# Patient Record
Sex: Female | Born: 1935 | Race: White | Hispanic: No | Marital: Married | Smoking: Never smoker
Health system: Southern US, Community
[De-identification: ages and names within clinical notes are randomized; demographics above are authoritative.]

## PROBLEM LIST (undated history)

## (undated) ENCOUNTER — Ambulatory Visit

## (undated) DIAGNOSIS — C801 Malignant (primary) neoplasm, unspecified: Secondary | ICD-10-CM

## (undated) DIAGNOSIS — I4891 Unspecified atrial fibrillation: Secondary | ICD-10-CM

## (undated) DIAGNOSIS — I10 Essential (primary) hypertension: Secondary | ICD-10-CM

## (undated) HISTORY — DX: Essential (primary) hypertension: I10

## (undated) HISTORY — DX: Malignant (primary) neoplasm, unspecified: C80.1

## (undated) HISTORY — PX: CERVICAL CONIZATION W/BX: SHX1330

## (undated) HISTORY — PX: EYE SURGERY: SHX253

---
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
## 1964-11-14 DIAGNOSIS — C801 Malignant (primary) neoplasm, unspecified: Secondary | ICD-10-CM

## 1964-11-14 HISTORY — DX: Malignant (primary) neoplasm, unspecified: C80.1

## 1964-11-14 HISTORY — PX: ABDOMINAL HYSTERECTOMY: SHX81

## 2004-11-17 ENCOUNTER — Ambulatory Visit: Payer: Self-pay | Admitting: Internal Medicine

## 2005-12-09 ENCOUNTER — Ambulatory Visit: Payer: Self-pay | Admitting: Internal Medicine

## 2007-01-30 ENCOUNTER — Ambulatory Visit: Payer: Self-pay | Admitting: Internal Medicine

## 2007-09-15 HISTORY — PX: COLONOSCOPY: SHX174

## 2007-09-24 ENCOUNTER — Ambulatory Visit: Payer: Self-pay | Admitting: General Surgery

## 2008-02-19 ENCOUNTER — Ambulatory Visit: Payer: Self-pay | Admitting: Internal Medicine

## 2009-02-20 ENCOUNTER — Ambulatory Visit: Payer: Self-pay | Admitting: Internal Medicine

## 2009-10-28 ENCOUNTER — Ambulatory Visit: Payer: Self-pay | Admitting: Ophthalmology

## 2010-01-13 ENCOUNTER — Ambulatory Visit: Payer: Self-pay | Admitting: Ophthalmology

## 2010-04-27 ENCOUNTER — Ambulatory Visit: Payer: Self-pay | Admitting: Internal Medicine

## 2011-06-08 ENCOUNTER — Ambulatory Visit: Payer: Self-pay | Admitting: Internal Medicine

## 2012-07-11 ENCOUNTER — Ambulatory Visit: Payer: Self-pay | Admitting: Internal Medicine

## 2014-06-11 ENCOUNTER — Ambulatory Visit: Payer: Self-pay | Admitting: Internal Medicine

## 2015-05-11 ENCOUNTER — Other Ambulatory Visit: Payer: Self-pay | Admitting: Internal Medicine

## 2015-05-11 DIAGNOSIS — Z1231 Encounter for screening mammogram for malignant neoplasm of breast: Secondary | ICD-10-CM

## 2015-06-15 ENCOUNTER — Ambulatory Visit
Admission: RE | Admit: 2015-06-15 | Discharge: 2015-06-15 | Disposition: A | Discharge status: Home / Self Care | Payer: Medicare Other | Source: Ambulatory Visit | Attending: Internal Medicine | Admitting: Internal Medicine

## 2015-06-15 DIAGNOSIS — Z1231 Encounter for screening mammogram for malignant neoplasm of breast: Secondary | ICD-10-CM | POA: Diagnosis present

## 2015-09-21 ENCOUNTER — Encounter: Payer: Self-pay | Admitting: General Surgery

## 2015-09-22 ENCOUNTER — Ambulatory Visit (INDEPENDENT_AMBULATORY_CARE_PROVIDER_SITE_OTHER): Payer: Medicare Other | Admitting: General Surgery

## 2015-09-22 ENCOUNTER — Encounter: Payer: Self-pay | Admitting: General Surgery

## 2015-09-22 VITALS — BP 132/82 | HR 64 | Resp 16 | Ht 66.0 in | Wt 153.0 lb

## 2015-09-22 DIAGNOSIS — Z800 Family history of malignant neoplasm of digestive organs: Secondary | ICD-10-CM | POA: Diagnosis not present

## 2015-09-22 DIAGNOSIS — Z1211 Encounter for screening for malignant neoplasm of colon: Secondary | ICD-10-CM | POA: Diagnosis not present

## 2015-09-22 MED ORDER — POLYETHYLENE GLYCOL 3350 17 GM/SCOOP PO POWD
1.0000 | Freq: Once | ORAL | Status: DC
Start: 2015-09-22 — End: 2015-09-29

## 2015-09-22 NOTE — H&P (Signed)
HPI  Sarah Bird is a 79 y.o. female here today for her recall colonoscopy . Patient last colonoscopy was on 09/24/2007. No GI problems at this time. Bowels move regular but irregularly, 2-3 days of small hard formed stools then she has a large normal bm.  HPI  Past Medical History   Diagnosis  Date   .  Hypertension    .  Cancer [facility])  1966     Cervical    Past Surgical History   Procedure  Laterality  Date   .  Colonoscopy   09/2007     Dr Bary Castilla   .  Abdominal hysterectomy   1966   .  Tonsillectomy   1945   .  Cervical conization w/bx     .  Eye surgery  Bilateral  4 years ago     cataracts    Family History   Problem  Relation  Age of Onset   .  Colon cancer  Mother  30   .  Colon cancer  Brother  22   .  Breast cancer  Maternal Aunt      3 aunts age 63's    Social History  Social History   Substance Use Topics   .  Smoking status:  Never Smoker   .  Smokeless tobacco:  Never Used   .  Alcohol Use:  Yes    Allergies   Allergen  Reactions   .  Other  Other (See Comments)   .  Sulfa Antibiotics  Rash    Current Outpatient Prescriptions   Medication  Sig  Dispense  Refill   .  aspirin 81 MG tablet  Take 81 mg by mouth daily.     .  calcium carbonate (OS-CAL - DOSED IN MG OF ELEMENTAL CALCIUM) 1250 (500 CA) MG tablet  Take 1 tablet by mouth daily with breakfast.     .  losartan-hydrochlorothiazide (HYZAAR) 100-12.5 MG tablet  Take 1 tablet by mouth daily.     .  Multiple Vitamins-Minerals (ICAPS AREDS FORMULA PO)  Take by mouth daily.     .  polyethylene glycol powder (GLYCOLAX/MIRALAX) powder  Take 255 g by mouth once.  255 g  0    No current facility-administered medications for this visit.    Review of Systems  Review of Systems  Constitutional: Negative.  Respiratory: Negative.  Cardiovascular: Negative.  Gastrointestinal: Negative for diarrhea, constipation and blood in stool.   Blood pressure 132/82, pulse 64, resp. rate 16, height 5\' 6"  (1.676 m),  weight 153 lb (69.4 kg).  Physical Exam  Physical Exam  Constitutional: She is oriented to person, place, and time. She appears well-developed and well-nourished.  Eyes: Conjunctivae are normal. No scleral icterus.  Neck: Neck supple.  Cardiovascular: Normal rate, regular rhythm and normal heart sounds.  Pulmonary/Chest: Effort normal and breath sounds normal.  Lymphadenopathy:  She has no cervical adenopathy.  Neurological: She is alert and oriented to person, place, and time.  Skin: Skin is warm and dry.   Data Reviewed  2008 colonoscopy reviewed. Normal.  Assessment   Family history colon cancer.   Plan    Colonoscopy with possible biopsy/polypectomy prn: Information regarding the procedure, including its potential risks and complications (including but not limited to perforation of the bowel, which may require emergency surgery to repair, and bleeding) was verbally given to the patient. Educational information regarding lower intestinal endoscopy was given to the patient. Written instructions for how to complete  the bowel prep using Miralax were provided. The importance of drinking ample fluids to avoid dehydration as a result of the prep emphasized.  Patient is scheduled for a Colonoscopy at [facility] on 09/29/15. She will only take her Hyzaar at 6 am the morning of. Miralax prescription has been sent into her pharmacy. The patient is aware of date and instructions.  PCP: Irish Elders  09/22/2015, 9:46 PM

## 2015-09-22 NOTE — Progress Notes (Signed)
Patient ID: Sarah Bird, female   DOB: Apr 16, 1936, 79 y.o.   MRN: [id number]  Chief Complaint  Patient presents with  . Colonoscopy    HPI Sarah Bird is a 79 y.o. female here today for her recall colonoscopy . Patient last colonoscopy was on 09/24/2007. No GI problems at this time. Bowels move regular but irregularly, 2-3 days of small hard formed stools then she has a large normal bm.  HPI  Past Medical History  Diagnosis Date  . Hypertension   . Cancer [facility]) 1966    Cervical    Past Surgical History  Procedure Laterality Date  . Colonoscopy  09/2007    Dr Bary Castilla  . Abdominal hysterectomy  1966  . Tonsillectomy  1945  . Cervical conization w/bx    . Eye surgery Bilateral 4 years ago    cataracts    Family History  Problem Relation Age of Onset  . Colon cancer Mother 57  . Colon cancer Brother 85  . Breast cancer Maternal Aunt     3 aunts age 28's    Social History Social History  Substance Use Topics  . Smoking status: Never Smoker   . Smokeless tobacco: Never Used  . Alcohol Use: Yes    Allergies  Allergen Reactions  . Other Other (See Comments)  . Sulfa Antibiotics Rash    Current Outpatient Prescriptions  Medication Sig Dispense Refill  . aspirin 81 MG tablet Take 81 mg by mouth daily.    . calcium carbonate (OS-CAL - DOSED IN MG OF ELEMENTAL CALCIUM) 1250 (500 CA) MG tablet Take 1 tablet by mouth daily with breakfast.    . losartan-hydrochlorothiazide (HYZAAR) 100-12.5 MG tablet Take 1 tablet by mouth daily.     . Multiple Vitamins-Minerals (ICAPS AREDS FORMULA PO) Take by mouth daily.    . polyethylene glycol powder (GLYCOLAX/MIRALAX) powder Take 255 g by mouth once. 255 g 0   No current facility-administered medications for this visit.    Review of Systems Review of Systems  Constitutional: Negative.   Respiratory: Negative.   Cardiovascular: Negative.   Gastrointestinal: Negative for diarrhea, constipation and blood in stool.     Blood pressure 132/82, pulse 64, resp. rate 16, height 5\' 6"  (1.676 m), weight 153 lb (69.4 kg).  Physical Exam Physical Exam  Constitutional: She is oriented to person, place, and time. She appears well-developed and well-nourished.  Eyes: Conjunctivae are normal. No scleral icterus.  Neck: Neck supple.  Cardiovascular: Normal rate, regular rhythm and normal heart sounds.   Pulmonary/Chest: Effort normal and breath sounds normal.  Lymphadenopathy:    She has no cervical adenopathy.  Neurological: She is alert and oriented to person, place, and time.  Skin: Skin is warm and dry.    Data Reviewed 2008 colonoscopy reviewed. Normal.  Assessment    Family history colon cancer.    Plan        Colonoscopy with possible biopsy/polypectomy prn: Information regarding the procedure, including its potential risks and complications (including but not limited to perforation of the bowel, which may require emergency surgery to repair, and bleeding) was verbally given to the patient. Educational information regarding lower intestinal endoscopy was given to the patient. Written instructions for how to complete the bowel prep using Miralax were provided. The importance of drinking ample fluids to avoid dehydration as a result of the prep emphasized.  Patient is scheduled for a Colonoscopy at [facility] on 09/29/15. She will only take her Hyzaar at 6 am  the morning of. Miralax prescription has been sent into her pharmacy. The patient is aware of date and instructions.   PCP:  Irish Elders 09/22/2015, 9:46 PM

## 2015-09-22 NOTE — Patient Instructions (Addendum)
Colonoscopy A colonoscopy is an exam to look at the entire large intestine (colon). This exam can help find problems such as tumors, polyps, inflammation, and areas of bleeding. The exam takes about 1 hour.  LET [facility] CARE PROVIDER KNOW ABOUT:   Any allergies you have.  All medicines you are taking, including vitamins, herbs, eye drops, creams, and over-the-counter medicines.  Previous problems you or members of your family have had with the use of anesthetics.  Any blood disorders you have.  Previous surgeries you have had.  Medical conditions you have. RISKS AND COMPLICATIONS  Generally, this is a safe procedure. However, as with any procedure, complications can occur. Possible complications include:  Bleeding.  Tearing or rupture of the colon wall.  Reaction to medicines given during the exam.  Infection (rare). BEFORE THE PROCEDURE   Ask your health care provider about changing or stopping your regular medicines.  You may be prescribed an oral bowel prep. This involves drinking a large amount of medicated liquid, starting the day before your procedure. The liquid will cause you to have multiple loose stools until your stool is almost clear or light green. This cleans out your colon in preparation for the procedure.  Do not eat or drink anything else once you have started the bowel prep, unless your health care provider tells you it is safe to do so.  Arrange for someone to drive you home after the procedure. PROCEDURE   You will be given medicine to help you relax (sedative).  You will lie on your side with your knees bent.  A long, flexible tube with a light and camera on the end (colonoscope) will be inserted through the rectum and into the colon. The camera sends video back to a computer screen as it moves through the colon. The colonoscope also releases carbon dioxide gas to inflate the colon. This helps your health care provider see the area better.  During  the exam, your health care provider may take a small tissue sample (biopsy) to be examined under a microscope if any abnormalities are found.  The exam is finished when the entire colon has been viewed. AFTER THE PROCEDURE   Do not drive for 24 hours after the exam.  You may have a small amount of blood in your stool.  You may pass moderate amounts of gas and have mild abdominal cramping or bloating. This is caused by the gas used to inflate your colon during the exam.  Ask when your test results will be ready and how you will get your results. Make sure you get your test results.   This information is not intended to replace advice given to you by your health care provider. Make sure you discuss any questions you have with your health care provider.   Document Released: 10/28/2000 Document Revised: 08/21/2013 Document Reviewed: 07/08/2013 Elsevier Interactive Patient Education [employer].  Patient is scheduled for a Colonoscopy at [facility] on 09/29/15. She will only take her Hyzaar at 6 am the morning of. Miralax prescription has been sent into her pharmacy. The patient is aware of date and instructions.

## 2015-09-29 ENCOUNTER — Ambulatory Visit: Payer: Medicare Other | Admitting: Anesthesiology

## 2015-09-29 ENCOUNTER — Ambulatory Visit
Admission: RE | Admit: 2015-09-29 | Discharge: 2015-09-29 | Disposition: A | Discharge status: Home / Self Care | Payer: Medicare Other | Source: Ambulatory Visit | Attending: General Surgery | Admitting: General Surgery

## 2015-09-29 ENCOUNTER — Encounter
Admission: RE | Disposition: A | Discharge status: Home / Self Care | Payer: Self-pay | Source: Ambulatory Visit | Attending: General Surgery

## 2015-09-29 ENCOUNTER — Encounter: Payer: Self-pay | Admitting: *Deleted

## 2015-09-29 DIAGNOSIS — I10 Essential (primary) hypertension: Secondary | ICD-10-CM | POA: Insufficient documentation

## 2015-09-29 DIAGNOSIS — Z800 Family history of malignant neoplasm of digestive organs: Secondary | ICD-10-CM

## 2015-09-29 DIAGNOSIS — Z1211 Encounter for screening for malignant neoplasm of colon: Secondary | ICD-10-CM

## 2015-09-29 DIAGNOSIS — Z7982 Long term (current) use of aspirin: Secondary | ICD-10-CM | POA: Insufficient documentation

## 2015-09-29 DIAGNOSIS — Z882 Allergy status to sulfonamides status: Secondary | ICD-10-CM | POA: Insufficient documentation

## 2015-09-29 DIAGNOSIS — Z79899 Other long term (current) drug therapy: Secondary | ICD-10-CM | POA: Insufficient documentation

## 2015-09-29 HISTORY — PX: COLONOSCOPY WITH PROPOFOL: SHX5780

## 2015-09-29 SURGERY — COLONOSCOPY WITH PROPOFOL
Anesthesia: General

## 2015-09-29 MED ORDER — PROPOFOL 10 MG/ML IV BOLUS
INTRAVENOUS | Status: DC | PRN
  Administered 2015-09-29: 40 mg via INTRAVENOUS

## 2015-09-29 MED ORDER — EPHEDRINE SULFATE 50 MG/ML IJ SOLN
INTRAMUSCULAR | Status: DC | PRN
  Administered 2015-09-29: 10 mg via INTRAVENOUS

## 2015-09-29 MED ORDER — SODIUM CHLORIDE 0.9 % IV SOLN
INTRAVENOUS | Status: DC
  Administered 2015-09-29: 14:00:00 via INTRAVENOUS

## 2015-09-29 MED ORDER — LIDOCAINE HCL (CARDIAC) 20 MG/ML IV SOLN
INTRAVENOUS | Status: DC | PRN
  Administered 2015-09-29: 60 mg via INTRAVENOUS

## 2015-09-29 MED ORDER — PROPOFOL 500 MG/50ML IV EMUL
INTRAVENOUS | Status: DC | PRN
Start: 2015-09-29 — End: 2015-09-29
  Administered 2015-09-29: 140 ug/kg/min via INTRAVENOUS

## 2015-09-29 NOTE — Transfer of Care (Signed)
Immediate Anesthesia Transfer of Care Note  Patient: Sarah Bird  Procedure(s) Performed: Procedure(s): COLONOSCOPY WITH PROPOFOL (N/A)  Patient Location: Endoscopy Unit  Anesthesia Type:General  Level of Consciousness: awake, alert , oriented and patient cooperative  Airway & Oxygen Therapy: Patient Spontanous Breathing and Patient connected to nasal cannula oxygen  Post-op Assessment: Report given to RN, Post -op Vital signs reviewed and stable and Patient moving all extremities X 4  Post vital signs: Reviewed and stable  Last Vitals:  Filed Vitals:   09/29/15 1337  BP: 160/80  Pulse: 58  Temp: 36.2 C  Resp: 18    Complications: No apparent anesthesia complications

## 2015-09-29 NOTE — Op Note (Signed)
[facility]astroenterology Patient Name: Sarah Bird Procedure Date: 09/29/2015 2:18 PM MRN: [id number] Account #: [id number] Date of Birth: 1936-02-12 Admit Type: Outpatient Age: 79 Room: [facility] ENDO ROOM 1 Gender: Female Note Status: Finalized Procedure:         Colonoscopy Indications:       Family history of colon cancer in a first-degree relative Providers:         Robert Bellow, MD Referring MD:      Ocie Cornfield. Ouida Sills, MD (Referring MD) Medicines:         Monitored Anesthesia Care Complications:     No immediate complications. Procedure:         Pre-Anesthesia Assessment:                    - Prior to the procedure, a History and Physical was                     performed, and patient medications, allergies and                     sensitivities were reviewed. The patient's tolerance of                     previous anesthesia was reviewed.                    - The risks and benefits of the procedure and the sedation                     options and risks were discussed with the patient. All                     questions were answered and informed consent was obtained.                    After obtaining informed consent, the colonoscope was                     passed under direct vision. Throughout the procedure, the                     patient's blood pressure, pulse, and oxygen saturations                     were monitored continuously. The Colonoscope was                     introduced through the anus and advanced to the the cecum,                     identified by appendiceal orifice and ileocecal valve. The                     patient tolerated the procedure well. The colonoscopy was                     technically difficult and complex due to significant                     looping and a tortuous colon. Successful completion of the                     procedure was aided by changing the patient to a supine  position. The  quality of the bowel preparation was                     excellent. Findings:      The entire examined colon appeared normal on direct and retroflexion       views. Impression:        - The entire examined colon is normal on direct and                     retroflexion views.                    - No specimens collected. Recommendation:    - Repeat colonoscopy in 5 years for surveillance. Procedure Code(s): --- Professional ---                    [phone], Colonoscopy, flexible; diagnostic, including                     collection of specimen(s) by brushing or washing, when                     performed (separate procedure) Diagnosis Code(s): --- Professional ---                    Z80.0, Family history of malignant neoplasm of digestive                     organs CPT copyright 2014 American Medical Association. All rights reserved. The codes documented in this report are preliminary and upon coder review may  be revised to meet current compliance requirements. Robert Bellow, MD 09/29/2015 3:10:16 PM This report has been signed electronically. Number of Addenda: 0 Note Initiated On: 09/29/2015 2:18 PM Scope Withdrawal Time: 0 hours 8 minutes 24 seconds  Total Procedure Duration: 0 hours 38 minutes 5 seconds       [facility]

## 2015-09-29 NOTE — H&P (Signed)
Sarah Bird [id number] [birth year]     HPI: Family history colon cancer. Screening exam.   Prescriptions prior to admission  Medication Sig Dispense Refill Last Dose  . losartan-hydrochlorothiazide (HYZAAR) 100-12.5 MG tablet Take 1 tablet by mouth daily.    09/29/2015 at 0730  . aspirin 81 MG tablet Take 81 mg by mouth daily.     . calcium carbonate (OS-CAL - DOSED IN MG OF ELEMENTAL CALCIUM) 1250 (500 CA) MG tablet Take 1 tablet by mouth daily with breakfast.     . Multiple Vitamins-Minerals (ICAPS AREDS FORMULA PO) Take by mouth daily.     . polyethylene glycol powder (GLYCOLAX/MIRALAX) powder Take 255 g by mouth once. 255 g 0    Allergies  Allergen Reactions  . Other Other (See Comments)  . Sulfa Antibiotics Rash   Past Medical History  Diagnosis Date  . Hypertension   . Cancer [facility]) 1966    Cervical   Past Surgical History  Procedure Laterality Date  . Colonoscopy  09/2007    Dr Bary Castilla  . Abdominal hysterectomy  1966  . Tonsillectomy  1945  . Cervical conization w/bx    . Eye surgery Bilateral 4 years ago    cataracts   Social History   Social History  . Marital Status: Married    Spouse Name: N/A  . Number of Children: N/A  . Years of Education: N/A   Occupational History  . Not on file.   Social History Main Topics  . Smoking status: Never Smoker   . Smokeless tobacco: Never Used  . Alcohol Use: 4.2 oz/week    7 Glasses of wine per week  . Drug Use: No  . Sexual Activity: Not on file   Other Topics Concern  . Not on file   Social History Narrative   Social History   Social History Narrative     ROS: Negative.     PE: HEENT: Negative. Lungs: Clear. Cardio: RR. Robert Bellow 09/29/2015   Assessment/Plan:  Proceed with planned endoscopy.

## 2015-09-29 NOTE — Anesthesia Preprocedure Evaluation (Signed)
Anesthesia Evaluation  Patient identified by MRN, date of birth, ID band Patient awake    Reviewed: Allergy & Precautions, NPO status , Patient's Chart, lab work & pertinent test results  Airway Mallampati: II  TM Distance: >3 FB     Dental  (+) Caps   Pulmonary neg pulmonary ROS,    Pulmonary exam normal breath sounds clear to auscultation       Cardiovascular hypertension, Pt. on medications Normal cardiovascular exam     Neuro/Psych negative neurological ROS  negative psych ROS   GI/Hepatic negative GI ROS, Neg liver ROS,   Endo/Other  negative endocrine ROS  Renal/GU negative Renal ROS  negative genitourinary   Musculoskeletal negative musculoskeletal ROS (+)   Abdominal Normal abdominal exam  (+)   Peds negative pediatric ROS (+)  Hematology negative hematology ROS (+)   Anesthesia Other Findings   Reproductive/Obstetrics                             Anesthesia Physical Anesthesia Plan  ASA: II  Anesthesia Plan: General   Post-op Pain Management:    Induction: Intravenous  Airway Management Planned: Nasal Cannula  Additional Equipment:   Intra-op Plan:   Post-operative Plan:   Informed Consent: I have reviewed the patients History and Physical, chart, labs and discussed the procedure including the risks, benefits and alternatives for the proposed anesthesia with the patient or authorized representative who has indicated his/her understanding and acceptance.   Dental advisory given  Plan Discussed with: CRNA and Surgeon  Anesthesia Plan Comments:         Anesthesia Quick Evaluation

## 2015-09-30 ENCOUNTER — Encounter: Payer: Self-pay | Admitting: General Surgery

## 2015-09-30 NOTE — Anesthesia Postprocedure Evaluation (Signed)
  Anesthesia Post-op Note  Patient: Sarah Bird  Procedure(s) Performed: Procedure(s): COLONOSCOPY WITH PROPOFOL (N/A)  Anesthesia type:General  Patient location: PACU  Post pain: Pain level controlled  Post assessment: Post-op Vital signs reviewed, Patient's Cardiovascular Status Stable, Respiratory Function Stable, Patent Airway and No signs of Nausea or vomiting  Post vital signs: Reviewed and stable  Last Vitals:  Filed Vitals:   09/29/15 1547  BP: 146/77  Pulse: 57  Temp:   Resp: 19    Level of consciousness: awake, alert  and patient cooperative  Complications: No apparent anesthesia complications

## 2016-05-09 ENCOUNTER — Other Ambulatory Visit: Payer: Self-pay | Admitting: Internal Medicine

## 2016-05-09 DIAGNOSIS — Z1231 Encounter for screening mammogram for malignant neoplasm of breast: Secondary | ICD-10-CM

## 2016-06-15 ENCOUNTER — Ambulatory Visit: Payer: Medicare Other

## 2016-06-27 ENCOUNTER — Other Ambulatory Visit: Payer: Self-pay | Admitting: Internal Medicine

## 2016-06-27 ENCOUNTER — Ambulatory Visit
Admission: RE | Admit: 2016-06-27 | Discharge: 2016-06-27 | Disposition: A | Discharge status: Home / Self Care | Payer: Medicare Other | Source: Ambulatory Visit | Attending: Internal Medicine | Admitting: Internal Medicine

## 2016-06-27 DIAGNOSIS — Z1231 Encounter for screening mammogram for malignant neoplasm of breast: Secondary | ICD-10-CM | POA: Insufficient documentation

## 2017-06-08 ENCOUNTER — Other Ambulatory Visit: Payer: Self-pay | Admitting: Internal Medicine

## 2017-06-08 DIAGNOSIS — Z1231 Encounter for screening mammogram for malignant neoplasm of breast: Secondary | ICD-10-CM

## 2017-07-03 ENCOUNTER — Ambulatory Visit
Admission: RE | Admit: 2017-07-03 | Discharge: 2017-07-03 | Disposition: A | Discharge status: Home / Self Care | Payer: Medicare Other | Source: Ambulatory Visit | Attending: Internal Medicine | Admitting: Internal Medicine

## 2017-07-03 DIAGNOSIS — Z1231 Encounter for screening mammogram for malignant neoplasm of breast: Secondary | ICD-10-CM

## 2018-05-22 ENCOUNTER — Other Ambulatory Visit: Payer: Self-pay | Admitting: Internal Medicine

## 2018-05-22 DIAGNOSIS — Z1231 Encounter for screening mammogram for malignant neoplasm of breast: Secondary | ICD-10-CM

## 2018-07-04 ENCOUNTER — Ambulatory Visit
Admission: RE | Admit: 2018-07-04 | Discharge: 2018-07-04 | Disposition: A | Discharge status: Home / Self Care | Payer: Medicare Other | Source: Ambulatory Visit | Attending: Internal Medicine | Admitting: Internal Medicine

## 2018-07-04 DIAGNOSIS — Z1231 Encounter for screening mammogram for malignant neoplasm of breast: Secondary | ICD-10-CM

## 2019-06-07 ENCOUNTER — Other Ambulatory Visit: Payer: Self-pay | Admitting: Internal Medicine

## 2019-06-07 DIAGNOSIS — Z1231 Encounter for screening mammogram for malignant neoplasm of breast: Secondary | ICD-10-CM

## 2019-07-08 ENCOUNTER — Encounter (INDEPENDENT_AMBULATORY_CARE_PROVIDER_SITE_OTHER): Payer: Self-pay

## 2019-07-08 ENCOUNTER — Other Ambulatory Visit: Payer: Self-pay

## 2019-07-08 ENCOUNTER — Ambulatory Visit
Admission: RE | Admit: 2019-07-08 | Discharge: 2019-07-08 | Disposition: A | Discharge status: Home / Self Care | Payer: Medicare Other | Source: Ambulatory Visit | Attending: Internal Medicine | Admitting: Internal Medicine

## 2019-07-08 DIAGNOSIS — Z1231 Encounter for screening mammogram for malignant neoplasm of breast: Secondary | ICD-10-CM | POA: Insufficient documentation

## 2020-08-14 ENCOUNTER — Other Ambulatory Visit: Payer: Self-pay | Admitting: Internal Medicine

## 2020-08-14 DIAGNOSIS — Z1231 Encounter for screening mammogram for malignant neoplasm of breast: Secondary | ICD-10-CM

## 2020-08-26 ENCOUNTER — Ambulatory Visit
Admission: RE | Admit: 2020-08-26 | Discharge: 2020-08-26 | Disposition: A | Discharge status: Home / Self Care | Payer: Medicare Other | Source: Ambulatory Visit | Attending: Internal Medicine | Admitting: Internal Medicine

## 2020-08-26 ENCOUNTER — Other Ambulatory Visit: Payer: Self-pay

## 2020-08-26 DIAGNOSIS — Z1231 Encounter for screening mammogram for malignant neoplasm of breast: Secondary | ICD-10-CM | POA: Insufficient documentation

## 2022-09-07 IMAGING — MG DIGITAL SCREENING BILAT W/ TOMO W/ CAD
8 series · 8 of 24 positions shown · non-contrast
Comparison: Previous exam(s).

CLINICAL DATA: Screening.

EXAM:
DIGITAL SCREENING BILATERAL MAMMOGRAM WITH TOMO AND CAD

[L CC synth-2D]
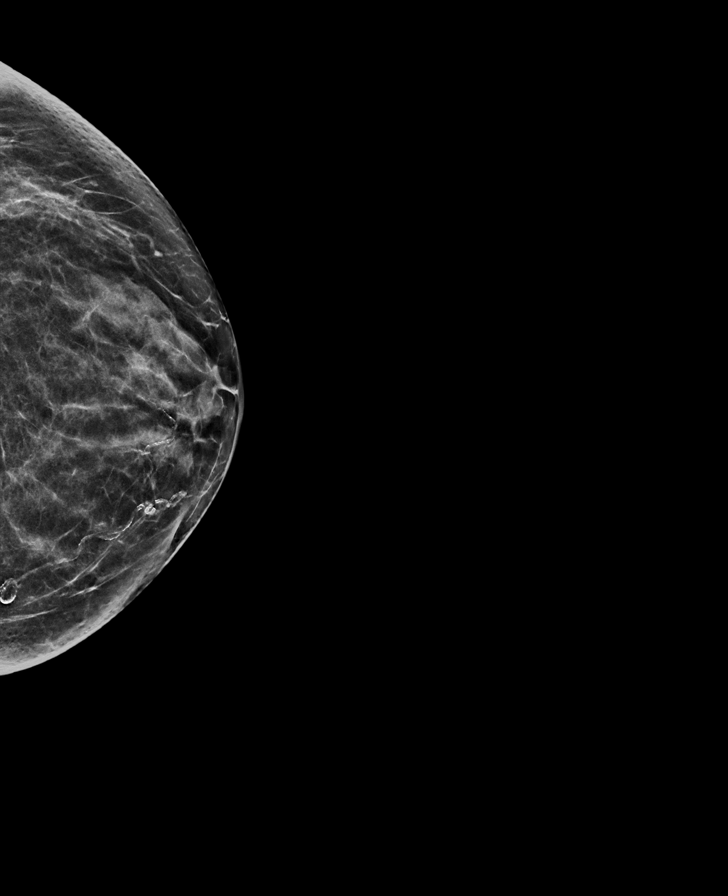

[L MLO synth-2D]
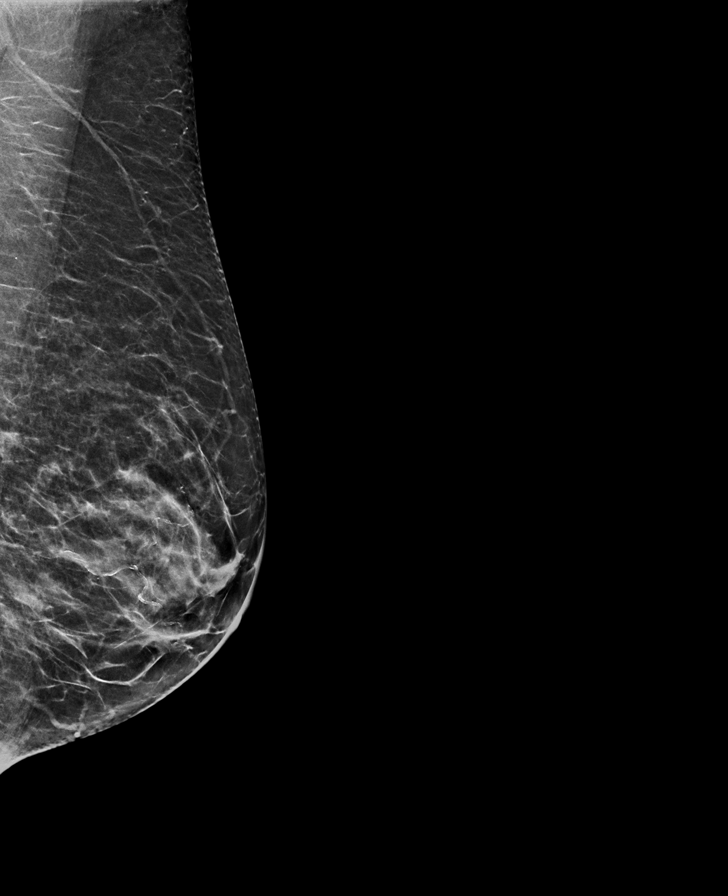

[R CC synth-2D]
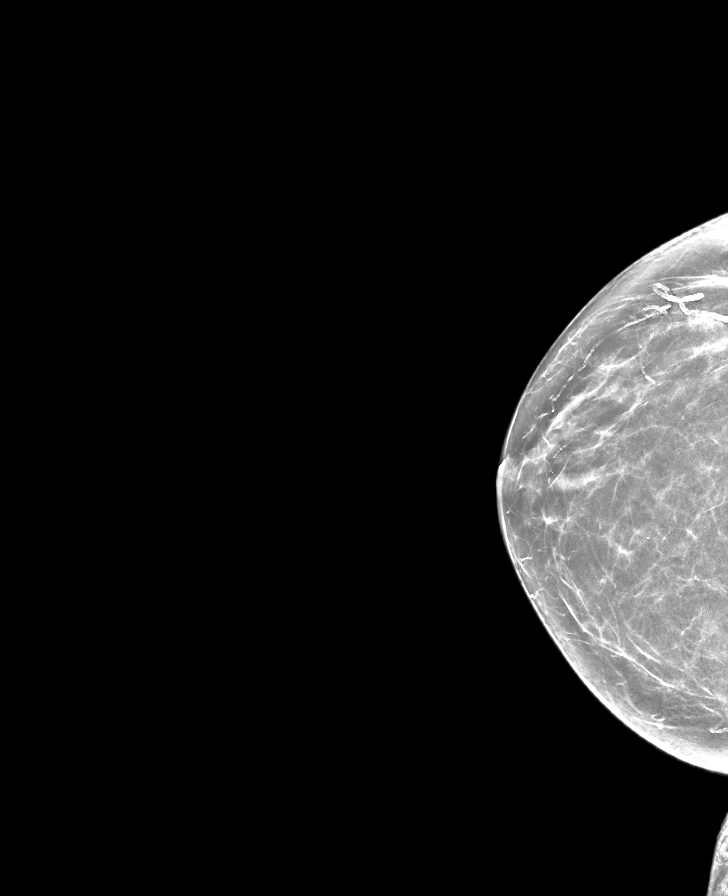

[R MLO synth-2D]
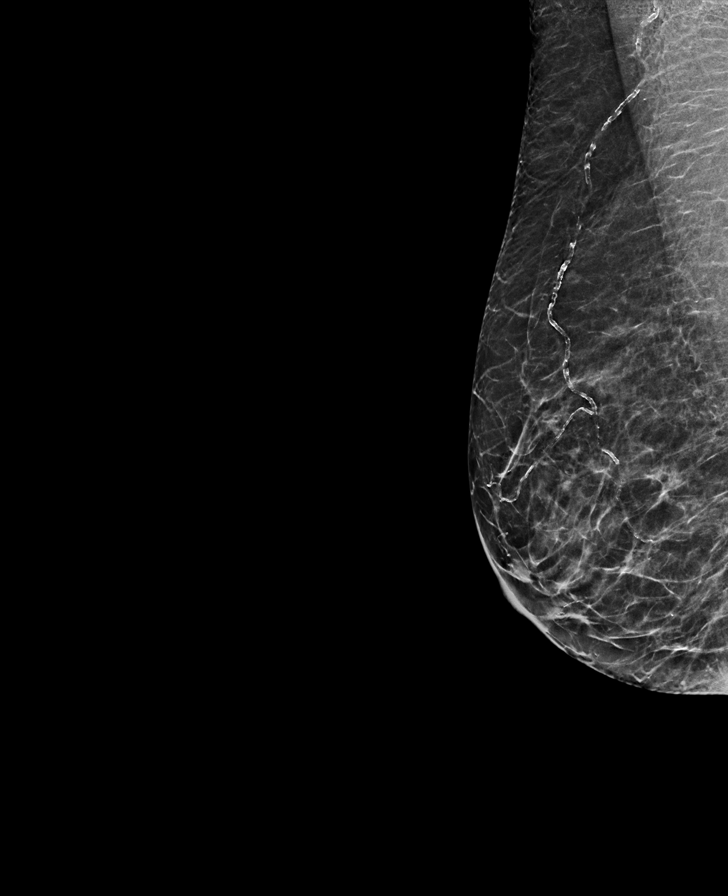

[L CC tomo · tomo slice 31/62.0]
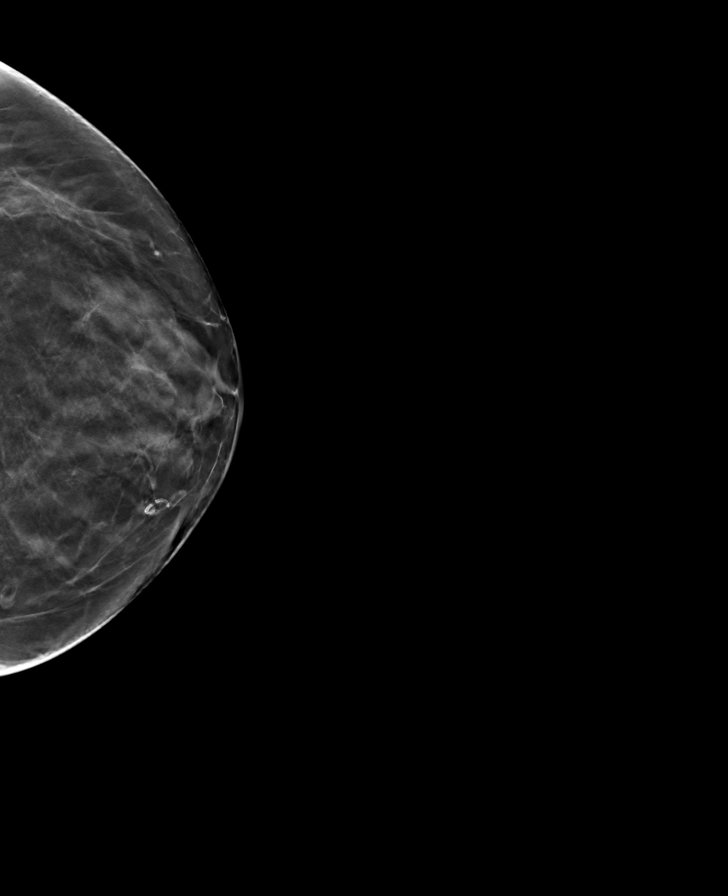

[L MLO tomo · tomo slice 31/61.0]
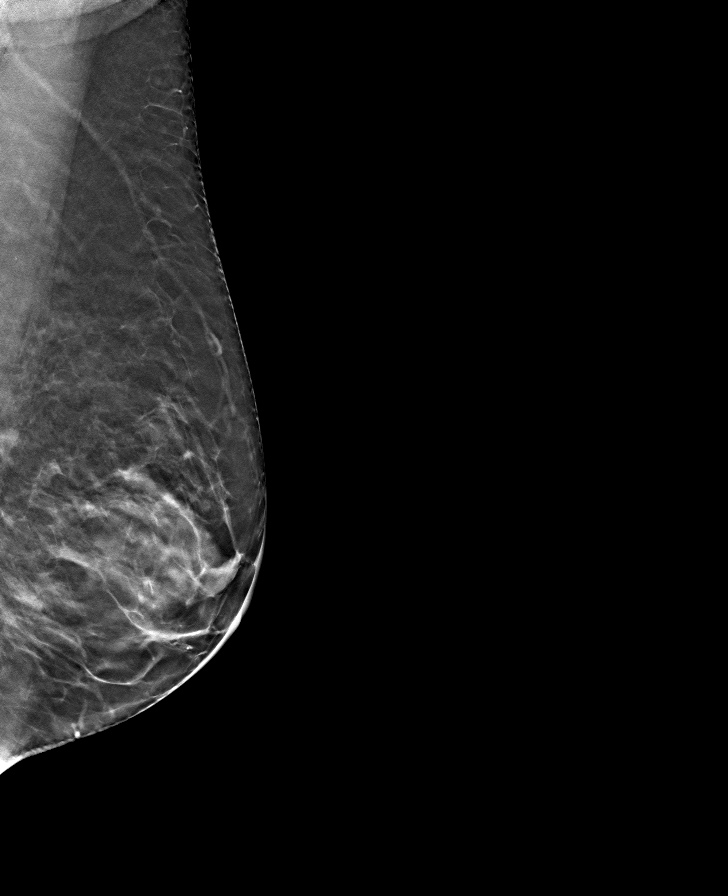

[R CC tomo · tomo slice 33/66.0]
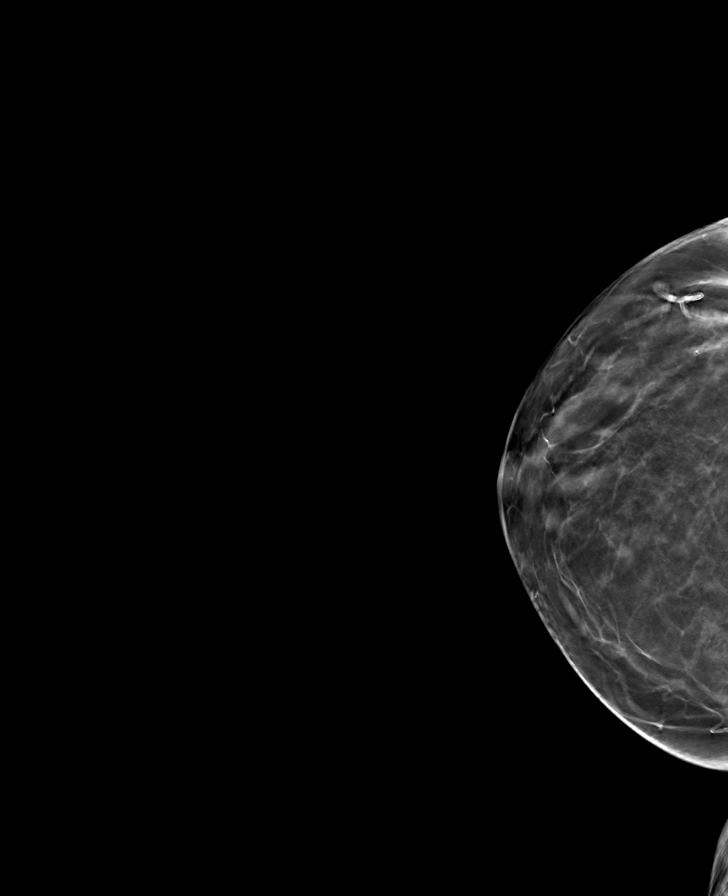

[R MLO tomo · tomo slice 31/61.0]
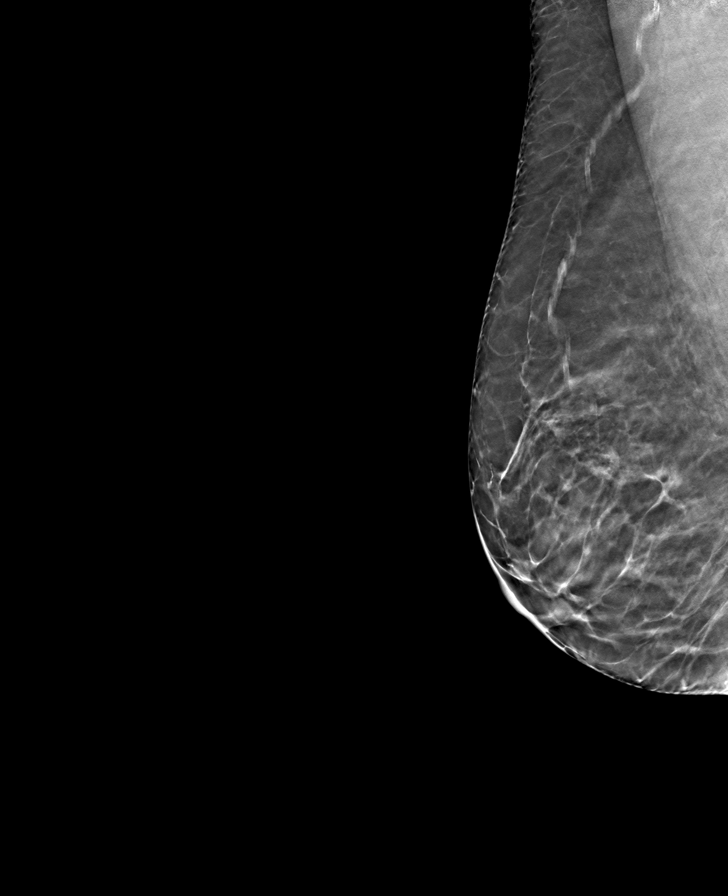

[8 of 24 positions shown; findings below may reference images not displayed]

ACR Breast Density Category b: There are scattered areas of
fibroglandular density.
FINDINGS: There are no findings suspicious for malignancy. Images were
processed with CAD.
IMPRESSION: No mammographic evidence of malignancy. A result letter of this
screening mammogram will be mailed directly to the patient.

RECOMMENDATION:
Screening mammogram in one year. (Code:CN-U-775)

BI-RADS CATEGORY  1: Negative.

## 2023-04-12 ENCOUNTER — Ambulatory Visit
Admission: EM | Admit: 2023-04-12 | Discharge: 2023-04-12 | Disposition: A | Discharge status: Home / Self Care | Payer: Medicare PPO | Attending: Physician Assistant | Admitting: Physician Assistant

## 2023-04-12 DIAGNOSIS — J029 Acute pharyngitis, unspecified: Secondary | ICD-10-CM | POA: Insufficient documentation

## 2023-04-12 DIAGNOSIS — U071 COVID-19: Secondary | ICD-10-CM | POA: Diagnosis present

## 2023-04-12 DIAGNOSIS — R051 Acute cough: Secondary | ICD-10-CM | POA: Insufficient documentation

## 2023-04-12 LAB — SARS CORONAVIRUS 2 BY RT PCR: SARS Coronavirus 2 by RT PCR: POSITIVE — AB

## 2023-04-12 LAB — GROUP A STREP BY PCR: Group A Strep by PCR: NOT DETECTED

## 2023-04-12 MED ORDER — MOLNUPIRAVIR 200 MG PO CAPS: 4.0000 | ORAL_CAPSULE | Freq: Two times a day (BID) | ORAL | 0 refills | Status: AC

## 2023-04-12 MED ORDER — IPRATROPIUM BROMIDE 0.06 % NA SOLN: 2.0000 | Freq: Four times a day (QID) | NASAL | 0 refills | Status: DC

## 2023-04-12 MED ORDER — BENZONATATE 200 MG PO CAPS: 200.0000 mg | ORAL_CAPSULE | Freq: Three times a day (TID) | ORAL | 0 refills | Status: DC | PRN

## 2023-04-12 NOTE — ED Provider Notes (Signed)
[facility]    CSN: [id number] Arrival date & time: 04/12/23  0855      History   Chief Complaint Chief Complaint  Patient presents with   Sore Throat   Nasal Congestion   Cough    HPI Sarah Bird is a 87 y.o. female presenting for 3 day history of low grade fever, fatigue, sore throat, cough and congestion. Denies chest pain, SOB, diarrhea, or vomiting. Has been taking Claritin and Mucinex without relief. No sick contacts. No other concerns.  HPI  Past Medical History:  Diagnosis Date   Cancer [facility]) 1966   Cervical   Hypertension     Patient Active Problem List   Diagnosis Date Noted   Family history of colon cancer 09/22/2015   Encounter for screening colonoscopy 09/22/2015    Past Surgical History:  Procedure Laterality Date   ABDOMINAL HYSTERECTOMY  1966   CERVICAL CONIZATION W/BX     COLONOSCOPY  09/2007   Dr Lemar Livings   COLONOSCOPY WITH PROPOFOL N/A 09/29/2015   Procedure: COLONOSCOPY WITH PROPOFOL;  Surgeon: Earline Mayotte, MD;  Location: [facility] ENDOSCOPY;  Service: Endoscopy;  Laterality: N/A;   EYE SURGERY Bilateral 4 years ago   cataracts   TONSILLECTOMY  1945    OB History     Gravida  2   Para  2   Term      Preterm      AB      Living         SAB      IAB      Ectopic      Multiple      Live Births           Obstetric Comments  1st Menstrual Cycle:  15  1st Pregnancy:  24           Home Medications    Prior to Admission medications   Medication Sig Start Date End Date Taking? Authorizing Provider  azelastine (ASTELIN) 0.1 % nasal spray Place into the nose. 02/21/23 02/21/24 Yes [provider]  benzonatate (TESSALON) 200 MG capsule Take 1 capsule (200 mg total) by mouth 3 (three) times daily as needed for cough. 04/12/23  Yes Shirlee Latch, PA-C  Cholecalciferol 50 MCG (2000 UT) TABS Take by mouth. 02/21/23 02/21/24 Yes [provider]  cyanocobalamin (VITAMIN B12) 1000 MCG tablet Take  by mouth.   Yes [provider]  diltiazem (CARDIZEM CD) 120 MG 24 hr capsule Take by mouth. 03/03/23 03/02/24 Yes [provider]  ELIQUIS 5 MG TABS tablet Take 5 mg by mouth 2 (two) times daily.   Yes [provider]  furosemide (LASIX) 20 MG tablet Take by mouth. 02/15/22  Yes [provider]  ipratropium (ATROVENT) 0.06 % nasal spray Place 2 sprays into both nostrils 4 (four) times daily. 04/12/23  Yes Shirlee Latch, PA-C  molnupiravir EUA (LAGEVRIO) 200 MG CAPS capsule Take 4 capsules (800 mg total) by mouth 2 (two) times daily for 5 days. 04/12/23 04/17/23 Yes Shirlee Latch, PA-C  Multiple Vitamins-Minerals (ICAPS AREDS FORMULA PO) Take by mouth daily.   Yes [provider]  potassium chloride (KLOR-CON) 10 MEQ tablet Take 10 mEq by mouth daily.   Yes [provider]  aspirin 81 MG tablet Take 81 mg by mouth daily.    [provider]  calcium carbonate (OS-CAL - DOSED IN MG OF ELEMENTAL CALCIUM) 1250 (500 CA) MG tablet Take 1 tablet by  mouth daily with breakfast.    [provider]  losartan-hydrochlorothiazide (HYZAAR) 100-12.5 MG tablet Take 1 tablet by mouth daily.  07/08/15   [provider]    Family History Family History  Problem Relation Age of Onset   Colon cancer Mother 30   Colon cancer Brother 75   Breast cancer Maternal Aunt        3 aunts age 50's   Breast cancer Maternal Grandmother        mat great gm    Social History Social History   Tobacco Use   Smoking status: Never   Smokeless tobacco: Never  Substance Use Topics   Alcohol use: Yes    Alcohol/week: 7.0 standard drinks of alcohol    Types: 7 Glasses of wine per week   Drug use: No     Allergies   Losartan, Other, Risedronate, and Sulfa antibiotics   Review of Systems Review of Systems  Constitutional:  Positive for fatigue and fever. Negative for chills and diaphoresis.  HENT:  Positive for congestion, rhinorrhea and  sore throat. Negative for ear pain, sinus pressure and sinus pain.   Respiratory:  Positive for cough. Negative for shortness of breath.   Cardiovascular:  Negative for chest pain.  Gastrointestinal:  Negative for abdominal pain, nausea and vomiting.  Musculoskeletal:  Negative for arthralgias and myalgias.  Skin:  Negative for rash.  Neurological:  Positive for headaches. Negative for weakness.  Hematological:  Negative for adenopathy.     Physical Exam Triage Vital Signs ED Triage Vitals  Enc Vitals Group     BP      Pulse      Resp      Temp      Temp src      SpO2      Weight      Height      Head Circumference      Peak Flow      Pain Score      Pain Loc      Pain Edu?      Excl. in GC?    No data found.  Updated Vital Signs BP (!) 124/90   Pulse 87   Temp 98.7 F (37.1 C)   Resp 20   SpO2 98%    Physical Exam Vitals and nursing note reviewed.  Constitutional:      General: She is not in acute distress.    Appearance: Normal appearance. She is not ill-appearing or toxic-appearing.  HENT:     Head: Normocephalic and atraumatic.     Right Ear: Tympanic membrane, ear canal and external ear normal.     Left Ear: Tympanic membrane, ear canal and external ear normal.     Nose: Congestion present.     Mouth/Throat:     Mouth: Mucous membranes are moist.     Pharynx: Oropharynx is clear. Posterior oropharyngeal erythema present.  Eyes:     General: No scleral icterus.       Right eye: No discharge.        Left eye: No discharge.     Conjunctiva/sclera: Conjunctivae normal.  Cardiovascular:     Rate and Rhythm: Normal rate and regular rhythm.     Heart sounds: Normal heart sounds.  Pulmonary:     Effort: Pulmonary effort is normal. No respiratory distress.     Breath sounds: Normal breath sounds.  Musculoskeletal:     Cervical back: Neck supple.  Skin:    General:  Skin is dry.  Neurological:     General: No focal deficit present.     Mental Status:  She is alert. Mental status is at baseline.     Motor: No weakness.     Gait: Gait normal.  Psychiatric:        Mood and Affect: Mood normal.        Behavior: Behavior normal.        Thought Content: Thought content normal.      UC Treatments / Results  Labs (all labs ordered are listed, but only abnormal results are displayed) Labs Reviewed  SARS CORONAVIRUS 2 BY RT PCR - Abnormal; Notable for the following components:      Result Value   SARS Coronavirus 2 by RT PCR POSITIVE (*)    All other components within normal limits  GROUP A STREP BY PCR    EKG   Radiology No results found.  Procedures Procedures (including critical care time)  Medications Ordered in UC Medications - No data to display  Initial Impression / Assessment and Plan / UC Course  I have reviewed the triage vital signs and the nursing notes.  Pertinent labs & imaging results that were available during my care of the patient were reviewed by me and considered in my medical decision making (see chart for details).   87 y/o female presents for 3 day history of cough, congestion, sore throat, and headaches.  She is afebrile and overall well appearing. On exam she has nasal congestion and mild posterior pharyngeal erythema. Chest is CTA.   PCR strep and COVID testing obtained.  Negative strep.  Positive COVID.  Discussed results of labs with patient.  Patient's last GFR from 1 month ago was 63.  Prescribed Molnupiravir as I would like to try to avoid changing her dose of Eliquis and Cardizem given her history of chronic A-fib.  Also prescribed benzonatate and Atrovent nasal spray.  Reviewed current CDC guidelines, isolation protocol and ED precautions for COVID-19.  Final Clinical Impressions(s) / UC Diagnoses   Final diagnoses:  COVID-19  Acute cough  Sore throat     Discharge Instructions      -The COVID test is positive.  I sent molnupiravir antiviral medication to the pharmacy.  The goal  of this medication is to keep you from getting sicker and ending up in the hospital. - I also sent a cough medication and nasal spray. - Increase rest and fluids.  Tylenol as needed for low-grade fever or discomfort. - You should isolate until you have been fever free for greater than 24 hours and your symptoms are starting to improve.  As long as you are coughing wear a mask around others. - Need to be seen again if you have any uncontrollable fever, weakness or shortness of breath.     ED Prescriptions     Medication Sig Dispense Auth. Provider   molnupiravir EUA (LAGEVRIO) 200 MG CAPS capsule Take 4 capsules (800 mg total) by mouth 2 (two) times daily for 5 days. 40 capsule Eusebio Friendly B, PA-C   benzonatate (TESSALON) 200 MG capsule Take 1 capsule (200 mg total) by mouth 3 (three) times daily as needed for cough. 20 capsule Eusebio Friendly B, PA-C   ipratropium (ATROVENT) 0.06 % nasal spray Place 2 sprays into both nostrils 4 (four) times daily. 15 mL Shirlee Latch, PA-C      PDMP not reviewed this encounter.   Shirlee Latch, PA-C 04/12/23 1049

## 2023-04-12 NOTE — Discharge Instructions (Addendum)
-  The COVID test is positive.  I sent molnupiravir antiviral medication to the pharmacy.  The goal of this medication is to keep you from getting sicker and ending up in the hospital. - I also sent a cough medication and nasal spray. - Increase rest and fluids.  Tylenol as needed for low-grade fever or discomfort. - You should isolate until you have been fever free for greater than 24 hours and your symptoms are starting to improve.  As long as you are coughing wear a mask around others. - Need to be seen again if you have any uncontrollable fever, weakness or shortness of breath.

## 2023-04-12 NOTE — ED Triage Notes (Signed)
Pt c/o sore throat, cough, nasal and chest congestion with low grade fevers since Sunday. Pt tried claritin and mucinex and no relief

## 2023-06-10 ENCOUNTER — Ambulatory Visit
Admission: EM | Admit: 2023-06-10 | Discharge: 2023-06-10 | Disposition: A | Discharge status: Home / Self Care | Payer: Medicare PPO | Attending: Emergency Medicine | Admitting: Emergency Medicine

## 2023-06-10 ENCOUNTER — Encounter: Payer: Self-pay | Admitting: Emergency Medicine

## 2023-06-10 DIAGNOSIS — N390 Urinary tract infection, site not specified: Secondary | ICD-10-CM | POA: Insufficient documentation

## 2023-06-10 LAB — URINALYSIS, W/ REFLEX TO CULTURE (INFECTION SUSPECTED)
Bilirubin Urine: NEGATIVE
Glucose, UA: NEGATIVE mg/dL
Ketones, ur: NEGATIVE mg/dL
Nitrite: NEGATIVE
Protein, ur: NEGATIVE mg/dL
Specific Gravity, Urine: 1.015 (ref 1.005–1.030)
pH: 6.5 (ref 5.0–8.0)

## 2023-06-10 MED ORDER — CEPHALEXIN 500 MG PO CAPS: 500.0000 mg | ORAL_CAPSULE | Freq: Two times a day (BID) | ORAL | 0 refills | Status: DC

## 2023-06-10 MED ORDER — PHENAZOPYRIDINE HCL 200 MG PO TABS: 200.0000 mg | ORAL_TABLET | Freq: Three times a day (TID) | ORAL | 0 refills | Status: DC

## 2023-06-10 NOTE — Discharge Instructions (Addendum)
Take the Keflex twice daily for 5 days with food for treatment of urinary tract infection.  Use the Pyridium every 8 hours as needed for urinary discomfort.  This will turn your urine a bright red-orange.  Increase your oral fluid intake so that you increase your urine production and or flushing your urinary system.  Take an over-the-counter probiotic, such as Culturelle-Align-Activia, 1 hour after each dose of antibiotic to prevent diarrhea or yeast infections from forming.  We will culture urine and change the antibiotics if necessary.  Return for reevaluation, or see your primary care provider, for any new or worsening symptoms.  

## 2023-06-10 NOTE — ED Provider Notes (Signed)
[facility]    CSN: [id number] Arrival date & time: 06/10/23  1028      History   Chief Complaint Chief Complaint  Patient presents with   Back Pain   Urinary Frequency    HPI Sarah Bird is a 87 y.o. female.   HPI  87 year old female with a past medical history significant for cervical cancer, hypertension presents for evaluation of urinary complaints that started yesterday.  She does have some low back pain which started 2 days ago and then yesterday she developed urinary frequency, discomfort when she needs to urinate, and has also had increased nocturia and some urinary incontinence.  She denies any fever, blood in her urine, nausea, or vomiting.  Past Medical History:  Diagnosis Date   Cancer [facility]) 1966   Cervical   Hypertension     Patient Active Problem List   Diagnosis Date Noted   Family history of colon cancer 09/22/2015   Encounter for screening colonoscopy 09/22/2015    Past Surgical History:  Procedure Laterality Date   ABDOMINAL HYSTERECTOMY  1966   CERVICAL CONIZATION W/BX     COLONOSCOPY  09/2007   Dr Lemar Livings   COLONOSCOPY WITH PROPOFOL N/A 09/29/2015   Procedure: COLONOSCOPY WITH PROPOFOL;  Surgeon: Earline Mayotte, MD;  Location: [facility] ENDOSCOPY;  Service: Endoscopy;  Laterality: N/A;   EYE SURGERY Bilateral 4 years ago   cataracts   TONSILLECTOMY  1945    OB History     Gravida  2   Para  2   Term      Preterm      AB      Living         SAB      IAB      Ectopic      Multiple      Live Births           Obstetric Comments  1st Menstrual Cycle:  15  1st Pregnancy:  24           Home Medications    Prior to Admission medications   Medication Sig Start Date End Date Taking? Authorizing Provider  cephALEXin (KEFLEX) 500 MG capsule Take 1 capsule (500 mg total) by mouth 2 (two) times daily for 5 days. 06/10/23 06/15/23 Yes Becky Augusta, NP  Cholecalciferol 50 MCG (2000 UT) TABS Take by mouth.  02/21/23 02/21/24 Yes [provider]  diltiazem (CARDIZEM CD) 120 MG 24 hr capsule Take by mouth. 03/03/23 03/02/24 Yes [provider]  ELIQUIS 5 MG TABS tablet Take 5 mg by mouth 2 (two) times daily.   Yes [provider]  furosemide (LASIX) 20 MG tablet Take by mouth. 02/15/22  Yes [provider]  Multiple Vitamins-Minerals (ICAPS AREDS FORMULA PO) Take by mouth daily.   Yes [provider]  phenazopyridine (PYRIDIUM) 200 MG tablet Take 1 tablet (200 mg total) by mouth 3 (three) times daily. 06/10/23  Yes Becky Augusta, NP  potassium chloride (KLOR-CON) 10 MEQ tablet Take 10 mEq by mouth daily.   Yes [provider]  azelastine (ASTELIN) 0.1 % nasal spray Place into the nose. 02/21/23 02/21/24  [provider]  cyanocobalamin (VITAMIN B12) 1000 MCG tablet Take by mouth.    [provider]    Family History Family History  Problem Relation Age of Onset   Colon cancer Mother 33   Colon cancer Brother 81   Breast cancer Maternal Aunt  3 aunts age 21's   Breast cancer Maternal Grandmother        mat great gm    Social History Social History   Tobacco Use   Smoking status: Never   Smokeless tobacco: Never  Vaping Use   Vaping status: Never Used  Substance Use Topics   Alcohol use: Yes    Alcohol/week: 7.0 standard drinks of alcohol    Types: 7 Glasses of wine per week   Drug use: No     Allergies   Losartan, Other, Risedronate, and Sulfa antibiotics   Review of Systems Review of Systems  Constitutional:  Negative for fever.  Genitourinary:  Positive for dysuria, frequency and urgency. Negative for hematuria.  Musculoskeletal:  Negative for back pain.     Physical Exam Triage Vital Signs ED Triage Vitals  Encounter Vitals Group     BP      Systolic BP Percentile      Diastolic BP Percentile      Pulse      Resp      Temp      Temp src      SpO2      Weight      Height      Head Circumference       Peak Flow      Pain Score      Pain Loc      Pain Education      Exclude from Growth Chart    No data found.  Updated Vital Signs BP (!) 156/94 (BP Location: Left Arm)   Pulse 73   Temp 98.6 F (37 C) (Oral)   Resp 16   Ht 5\' 6"  (1.676 m)   Wt 149 lb 14.6 oz (68 kg)   SpO2 98%   BMI 24.20 kg/m   Visual Acuity Right Eye Distance:   Left Eye Distance:   Bilateral Distance:    Right Eye Near:   Left Eye Near:    Bilateral Near:     Physical Exam Vitals and nursing note reviewed.  Constitutional:      Appearance: Normal appearance. She is not ill-appearing.  HENT:     Head: Normocephalic and atraumatic.  Cardiovascular:     Rate and Rhythm: Normal rate and regular rhythm.     Pulses: Normal pulses.     Heart sounds: Normal heart sounds. No murmur heard.    No friction rub. No gallop.  Pulmonary:     Effort: Pulmonary effort is normal.     Breath sounds: Normal breath sounds. No wheezing, rhonchi or rales.  Abdominal:     Tenderness: There is no right CVA tenderness or left CVA tenderness.  Skin:    General: Skin is warm and dry.     Capillary Refill: Capillary refill takes less than 2 seconds.  Neurological:     General: No focal deficit present.     Mental Status: She is alert and oriented to person, place, and time.      UC Treatments / Results  Labs (all labs ordered are listed, but only abnormal results are displayed) Labs Reviewed  URINALYSIS, W/ REFLEX TO CULTURE (INFECTION SUSPECTED) - Abnormal; Notable for the following components:      Result Value   Hgb urine dipstick MODERATE (*)    Leukocytes,Ua LARGE (*)    Bacteria, UA MANY (*)    All other components within normal limits  URINE CULTURE    EKG   Radiology No results  found.  Procedures Procedures (including critical care time)  Medications Ordered in UC Medications - No data to display  Initial Impression / Assessment and Plan / UC Course  I have reviewed the triage vital  signs and the nursing notes.  Pertinent labs & imaging results that were available during my care of the patient were reviewed by me and considered in my medical decision making (see chart for details).   Patient is a nontoxic-appearing 87 year old female presenting for evaluation of 2 days with urinary symptoms as outlined in HPI above.  On exam she has no CVA tenderness.  Cardiopulmonary exam is benign.  I will order urinalysis to evaluate for the presence of UTI.  Urinalysis shows moderate hemoglobin with large leukocyte esterase.  Negative for nitrites or protein.  Reflex microscopy shows 21-50 WBCs, 11-20 RBCs, and many bacteria.  Urine will reflex to culture.  I will discharge patient home on Keflex 500 mg twice daily for 5 days for treatment of UTI along with Pyridium to help with urinary discomfort.  Return precautions reviewed.   Final Clinical Impressions(s) / UC Diagnoses   Final diagnoses:  Lower urinary tract infectious disease     Discharge Instructions      Take the Keflex twice daily for 5 days with food for treatment of urinary tract infection.  Use the Pyridium every 8 hours as needed for urinary discomfort.  This will turn your urine a bright red-orange.  Increase your oral fluid intake so that you increase your urine production and or flushing your urinary system.  Take an over-the-counter probiotic, such as Culturelle-Align-Activia, 1 hour after each dose of antibiotic to prevent diarrhea or yeast infections from forming.  We will culture urine and change the antibiotics if necessary.  Return for reevaluation, or see your primary care provider, for any new or worsening symptoms.      ED Prescriptions     Medication Sig Dispense Auth. Provider   cephALEXin (KEFLEX) 500 MG capsule Take 1 capsule (500 mg total) by mouth 2 (two) times daily for 5 days. 10 capsule Becky Augusta, NP   phenazopyridine (PYRIDIUM) 200 MG tablet Take 1 tablet (200 mg total) by mouth  3 (three) times daily. 6 tablet Becky Augusta, NP      PDMP not reviewed this encounter.   Becky Augusta, NP 06/10/23 1131

## 2023-06-10 NOTE — ED Triage Notes (Signed)
Pt c/o lower back pain, urinary frequency, and incontinence. Started yesterday.

## 2023-06-12 LAB — URINE CULTURE: Culture: 30000 — AB

## 2023-06-15 ENCOUNTER — Ambulatory Visit
Admission: RE | Admit: 2023-06-15 | Discharge: 2023-06-15 | Disposition: A | Discharge status: Home / Self Care | Payer: Medicare PPO | Source: Ambulatory Visit | Attending: Family Medicine | Admitting: Family Medicine

## 2023-06-15 VITALS — BP 129/93 | HR 82 | Temp 98.0°F | Ht 66.0 in | Wt 170.0 lb

## 2023-06-15 DIAGNOSIS — N3001 Acute cystitis with hematuria: Secondary | ICD-10-CM

## 2023-06-15 LAB — URINALYSIS, W/ REFLEX TO CULTURE (INFECTION SUSPECTED)
Bilirubin Urine: NEGATIVE
Glucose, UA: NEGATIVE mg/dL
Hgb urine dipstick: NEGATIVE
Ketones, ur: NEGATIVE mg/dL
Nitrite: NEGATIVE
Protein, ur: NEGATIVE mg/dL
RBC / HPF: NONE SEEN RBC/hpf (ref 0–5)
Specific Gravity, Urine: 1.01 (ref 1.005–1.030)
pH: 6 (ref 5.0–8.0)

## 2023-06-15 MED ORDER — CEFDINIR 300 MG PO CAPS: 300.0000 mg | ORAL_CAPSULE | Freq: Two times a day (BID) | ORAL | 0 refills | Status: DC

## 2023-06-15 NOTE — ED Provider Notes (Signed)
[facility]    CSN: [id number] Arrival date & time: 06/15/23  1417      History   Chief Complaint Chief Complaint  Patient presents with   Urinary Tract Infection     HPI HPI Sarah Bird is a 87 y.o. female.    Sarah Bird presents for concern for UTI. She just finished antibiotics yesterday.  Has back pain, urinary frequency. Took her antibiotics for UTI that were prescribed earlier and feels like her symptoms didn't go away.    Has swelling in her legs. She denies shortness of breath, PND, chest pain. Does not follow with a cardiologist.    Past Medical History:  Diagnosis Date   Cancer [facility]) 1966   Cervical   Hypertension     Patient Active Problem List   Diagnosis Date Noted   Family history of colon cancer 09/22/2015   Encounter for screening colonoscopy 09/22/2015    Past Surgical History:  Procedure Laterality Date   ABDOMINAL HYSTERECTOMY  1966   CERVICAL CONIZATION W/BX     COLONOSCOPY  09/2007   Dr Lemar Livings   COLONOSCOPY WITH PROPOFOL N/A 09/29/2015   Procedure: COLONOSCOPY WITH PROPOFOL;  Surgeon: Earline Mayotte, MD;  Location: [facility] ENDOSCOPY;  Service: Endoscopy;  Laterality: N/A;   EYE SURGERY Bilateral 4 years ago   cataracts   TONSILLECTOMY  1945    OB History     Gravida  2   Para  2   Term      Preterm      AB      Living         SAB      IAB      Ectopic      Multiple      Live Births           Obstetric Comments  1st Menstrual Cycle:  15  1st Pregnancy:  24           Home Medications    Prior to Admission medications   Medication Sig Start Date End Date Taking? Authorizing Provider  azelastine (ASTELIN) 0.1 % nasal spray Place into the nose. 02/21/23 02/21/24 Yes [provider]  cefdinir (OMNICEF) 300 MG capsule Take 1 capsule (300 mg total) by mouth 2 (two) times daily. 06/15/23  Yes Charliene Inoue, Seward Meth, DO  Cholecalciferol 50 MCG (2000 UT) TABS Take by mouth. 02/21/23 02/21/24 Yes  [provider]  cyanocobalamin (VITAMIN B12) 1000 MCG tablet Take by mouth.   Yes [provider]  diltiazem (CARDIZEM CD) 120 MG 24 hr capsule Take by mouth. 03/03/23 03/02/24 Yes [provider]  ELIQUIS 5 MG TABS tablet Take 5 mg by mouth 2 (two) times daily.   Yes [provider]  furosemide (LASIX) 20 MG tablet Take by mouth. 02/15/22  Yes [provider]  Multiple Vitamins-Minerals (ICAPS AREDS FORMULA PO) Take by mouth daily.   Yes [provider]  phenazopyridine (PYRIDIUM) 200 MG tablet Take 1 tablet (200 mg total) by mouth 3 (three) times daily. 06/10/23  Yes Becky Augusta, NP  potassium chloride (KLOR-CON) 10 MEQ tablet Take 10 mEq by mouth daily.   Yes [provider]    Family History Family History  Problem Relation Age of Onset   Colon cancer Mother 75   Colon cancer Brother 37   Breast cancer Maternal Aunt        3 aunts age 66's   Breast cancer Maternal Grandmother  mat great gm    Social History Social History   Tobacco Use   Smoking status: Never   Smokeless tobacco: Never  Vaping Use   Vaping status: Never Used  Substance Use Topics   Alcohol use: Yes    Alcohol/week: 7.0 standard drinks of alcohol    Types: 7 Glasses of wine per week   Drug use: No     Allergies   Losartan, Other, Risedronate, and Sulfa antibiotics   Review of Systems Review of Systems: :negative unless otherwise stated in HPI.      Physical Exam Triage Vital Signs ED Triage Vitals  Encounter Vitals Group     BP 06/15/23 1438 (!) 129/93     Systolic BP Percentile --      Diastolic BP Percentile --      Pulse Rate 06/15/23 1438 82     Resp --      Temp 06/15/23 1438 98 F (36.7 C)     Temp Source 06/15/23 1438 Oral     SpO2 06/15/23 1438 97 %     Weight 06/15/23 1437 170 lb (77.1 kg)     Height 06/15/23 1437 5\' 6"  (1.676 m)     Head Circumference --      Peak Flow --      Pain Score 06/15/23 1436 0      Pain Loc --      Pain Education --      Exclude from Growth Chart --    No data found.  Updated Vital Signs BP (!) 129/93 (BP Location: Left Arm)   Pulse 82   Temp 98 F (36.7 C) (Oral)   Ht 5\' 6"  (1.676 m)   Wt 77.1 kg   SpO2 97%   BMI 27.44 kg/m   Visual Acuity Right Eye Distance:   Left Eye Distance:   Bilateral Distance:    Right Eye Near:   Left Eye Near:    Bilateral Near:     Physical Exam GEN: well appearing female in no acute distress  CVS: well perfused  RESP: speaking in full sentences without pause      UC Treatments / Results  Labs (all labs ordered are listed, but only abnormal results are displayed) Labs Reviewed  URINALYSIS, W/ REFLEX TO CULTURE (INFECTION SUSPECTED) - Abnormal; Notable for the following components:      Result Value   Leukocytes,Ua SMALL (*)    Bacteria, UA FEW (*)    All other components within normal limits    EKG   Radiology No results found.  Procedures Procedures (including critical care time)  Medications Ordered in UC Medications - No data to display  Initial Impression / Assessment and Plan / UC Course  I have reviewed the triage vital signs and the nursing notes.  Pertinent labs & imaging results that were available during my care of the patient were reviewed by me and considered in my medical decision making (see chart for details).     Acute cystitis:  Patient is a 87 y.o. female  who presents for continued dysuria and urinary frequency.  Overall patient is well-appearing and afebrile.  Vital signs stable.    On chart review, Keflex prescribed for UTI twice a day. She may not have gotten good urine concentration with Keflex given twice a day. UA consistent with possible ongoing UTI. Treat with Cefdinir 2 times daily for 5 days. Urine culture was pansensitive on 06/10/23 when she was diagnosed and given Keflex.  Return  precautions including abdominal pain, fever, chills, nausea, or vomiting given.  Follow-up,  if symptoms not improving or getting worse. Discussed MDM, treatment plan and plan for follow-up with patient and her daughter who agree with plan.      Final Clinical Impressions(s) / UC Diagnoses   Final diagnoses:  Acute cystitis with hematuria     Discharge Instructions      Stop by the pharmacy to pick up your prescriptions.  Follow up with your primary care provider as needed.       ED Prescriptions     Medication Sig Dispense Auth. Provider   cefdinir (OMNICEF) 300 MG capsule Take 1 capsule (300 mg total) by mouth 2 (two) times daily. 10 capsule Katha Cabal, DO      PDMP not reviewed this encounter.   Katha Cabal, DO 06/15/23 1638

## 2023-06-15 NOTE — Discharge Instructions (Signed)
Stop by the pharmacy to pick up your prescriptions.  Follow up with your primary care provider as needed.  

## 2023-06-15 NOTE — ED Triage Notes (Signed)
Pt c/o continued discomfort, and lower leg swelling.  Pt states that she was seen earlier for a UTI and finished her antibiotics yesterday.   Pt states that she had a headache yesterday.  Pt states that her symptoms do not feel as bad.

## 2023-06-20 ENCOUNTER — Ambulatory Visit: Payer: Medicare PPO

## 2024-03-21 ENCOUNTER — Ambulatory Visit

## 2024-03-21 ENCOUNTER — Ambulatory Visit
Admission: RE | Admit: 2024-03-21 | Discharge: 2024-03-21 | Disposition: A | Discharge status: Home / Self Care | Source: Ambulatory Visit | Attending: Family Medicine | Admitting: Family Medicine

## 2024-03-21 VITALS — BP 146/93 | HR 89 | Temp 97.6°F | Resp 16

## 2024-03-21 DIAGNOSIS — N3001 Acute cystitis with hematuria: Secondary | ICD-10-CM | POA: Diagnosis not present

## 2024-03-21 LAB — URINALYSIS, ROUTINE W REFLEX MICROSCOPIC
Bilirubin Urine: NEGATIVE
Glucose, UA: NEGATIVE mg/dL
Ketones, ur: NEGATIVE mg/dL
Nitrite: NEGATIVE
Protein, ur: NEGATIVE mg/dL
Specific Gravity, Urine: 1.01 (ref 1.005–1.030)
pH: 6 (ref 5.0–8.0)

## 2024-03-21 LAB — URINALYSIS, MICROSCOPIC (REFLEX): WBC, UA: >50 WBC/hpf (ref 0–5)

## 2024-03-21 MED ORDER — NITROFURANTOIN MONOHYD MACRO 100 MG PO CAPS: 100.0000 mg | ORAL_CAPSULE | Freq: Two times a day (BID) | ORAL | 0 refills | Status: DC

## 2024-03-21 NOTE — ED Provider Notes (Signed)
 [facility]    CSN: [id number] Arrival date & time: 03/21/24  1109      History   Chief Complaint Chief Complaint  Patient presents with   Urinary Frequency     HPI HPI CHARLISSA BRYAND is a 88 y.o. female.    Alban Alm presents for lower abdominal discomfort, dysuria, urinary frequency that started yesterday.  Tried nothing prior to arrival.  Has not had any antibiotics in last 30 days.   No LMP recorded. Patient has had a hysterectomy.    - Abnormal vaginal discharge: no - vaginal odor: no - vaginal bleeding: no - Dysuria: yes - Hematuria: no - Urinary urgency: yes  - Urinary frequency: yes  - Fever: no - Abdominal pain: lower  - Pelvic pain: no - Rash/Skin lesions/mouth ulcers: no - Nausea: no  - Vomiting: no  - Back Pain: no        Past Medical History:  Diagnosis Date   Cancer ([facility]) 1966   Cervical   Hypertension     Patient Active Problem List   Diagnosis Date Noted   Family history of colon cancer 09/22/2015   Encounter for screening colonoscopy 09/22/2015    Past Surgical History:  Procedure Laterality Date   ABDOMINAL HYSTERECTOMY  1966   CERVICAL CONIZATION W/BX     COLONOSCOPY  09/2007   Dr Marquita Situ   COLONOSCOPY WITH PROPOFOL  N/A 09/29/2015   Procedure: COLONOSCOPY WITH PROPOFOL ;  Surgeon: Marshall Skeeter, MD;  Location: [facility] ENDOSCOPY;  Service: Endoscopy;  Laterality: N/A;   EYE SURGERY Bilateral 4 years ago   cataracts   TONSILLECTOMY  1945    OB History     Gravida  2   Para  2   Term      Preterm      AB      Living         SAB      IAB      Ectopic      Multiple      Live Births           Obstetric Comments  1st Menstrual Cycle:  15  1st Pregnancy:  24           Home Medications    Prior to Admission medications   Medication Sig Start Date End Date Taking? Authorizing Provider  cyanocobalamin (VITAMIN B12) 1000 MCG tablet Take by mouth.   Yes [provider]   diltiazem (CARDIZEM CD) 120 MG 24 hr capsule Take by mouth. 03/03/23 03/21/24 Yes [provider]  ELIQUIS 5 MG TABS tablet Take 5 mg by mouth 2 (two) times daily.   Yes [provider]  furosemide (LASIX) 20 MG tablet Take by mouth. 02/15/22  Yes [provider]  Multiple Vitamins-Minerals (ICAPS AREDS FORMULA PO) Take by mouth daily.   Yes [provider]  nitrofurantoin, macrocrystal-monohydrate, (MACROBID) 100 MG capsule Take 1 capsule (100 mg total) by mouth 2 (two) times daily. 03/21/24  Yes Shaely Gadberry, DO  potassium chloride (KLOR-CON) 10 MEQ tablet Take 10 mEq by mouth daily.   Yes [provider]  azelastine (ASTELIN) 0.1 % nasal spray Place into the nose. 02/21/23 02/21/24  [provider]  cefdinir  (OMNICEF ) 300 MG capsule Take 1 capsule (300 mg total) by mouth 2 (two) times daily. 06/15/23   Sonita Michiels, DO  phenazopyridine  (PYRIDIUM ) 200 MG tablet Take 1 tablet (200 mg total) by mouth 3 (three) times daily. 06/10/23  Kent Pear, NP    Family History Family History  Problem Relation Age of Onset   Colon cancer Mother 77   Colon cancer Brother 26   Breast cancer Maternal Aunt        3 aunts age 2's   Breast cancer Maternal Grandmother        mat great gm    Social History Social History   Tobacco Use   Smoking status: Never   Smokeless tobacco: Never  Vaping Use   Vaping status: Never Used  Substance Use Topics   Alcohol use: Yes    Alcohol/week: 7.0 standard drinks of alcohol    Types: 7 Glasses of wine per week   Drug use: No     Allergies   Losartan, Other, Risedronate, and Sulfa antibiotics   Review of Systems Review of Systems: :negative unless otherwise stated in HPI.      Physical Exam Triage Vital Signs ED Triage Vitals  Encounter Vitals Group     BP 03/21/24 1154 (!) 146/93     Systolic BP Percentile --      Diastolic BP Percentile --      Pulse Rate 03/21/24 1154 89     Resp 03/21/24  1154 16     Temp 03/21/24 1154 97.6 F (36.4 C)     Temp Source 03/21/24 1154 Oral     SpO2 03/21/24 1154 96 %     Weight --      Height --      Head Circumference --      Peak Flow --      Pain Score 03/21/24 1155 0     Pain Loc --      Pain Education --      Exclude from Growth Chart --    No data found.  Updated Vital Signs BP (!) 146/93 (BP Location: Left Arm)   Pulse 89   Temp 97.6 F (36.4 C) (Oral)   Resp 16   SpO2 96%   Visual Acuity Right Eye Distance:   Left Eye Distance:   Bilateral Distance:    Right Eye Near:   Left Eye Near:    Bilateral Near:     Physical Exam GEN: well appearing female in no acute distress  CVS: well perfused  RESP: speaking in full sentences without pause  ABD: soft, suprapubic tenderness, non-distended, no palpable masses, no CVA tenderness bilaterally    UC Treatments / Results  Labs (all labs ordered are listed, but only abnormal results are displayed) Labs Reviewed  URINALYSIS, ROUTINE W REFLEX MICROSCOPIC - Abnormal; Notable for the following components:      Result Value   Hgb urine dipstick SMALL (*)    Leukocytes,Ua MODERATE (*)    All other components within normal limits  URINALYSIS, MICROSCOPIC (REFLEX) - Abnormal; Notable for the following components:   Bacteria, UA MANY (*)    All other components within normal limits  URINE CULTURE    EKG   Radiology No results found.  Procedures Procedures (including critical care time)  Medications Ordered in UC Medications - No data to display  Initial Impression / Assessment and Plan / UC Course  I have reviewed the triage vital signs and the nursing notes.  Pertinent labs & imaging results that were available during my care of the patient were reviewed by me and considered in my medical decision making (see chart for details).      Acute cystitis:  Patient is a 88 y.o.  female  who presents for 1 day of dysuria and urinary frequency with urgency.  Overall  patient is well-appearing and afebrile.  Vital signs stable.  UA consistent with acute cystitis.  Hematuria supported on microscopy.  Patient has 2 UTI's in the past year. Treat with Macrobid 2 times daily for 5 days.  Urine culture obtained.  Follow-up sensitivities and change antibiotics, if needed.   - Return precautions including abdominal pain, fever, chills, nausea, or vomiting given. Follow-up,  if symptoms not improving or getting worse. Discussed MDM, treatment plan and plan for follow-up with patientwho agrees with plan.        Final Clinical Impressions(s) / UC Diagnoses   Final diagnoses:  Acute cystitis with hematuria     Discharge Instructions      You have a urinary tract infection. Stop by the pharmacy to pick up your prescriptions.  Follow up with your primary care provider or return to the urgent care, if not improving.   I sent your urine for culture. Someone may call you to change antibiotics.    ED Prescriptions     Medication Sig Dispense Auth. Provider   nitrofurantoin, macrocrystal-monohydrate, (MACROBID) 100 MG capsule Take 1 capsule (100 mg total) by mouth 2 (two) times daily. 14 capsule Cayde Held, DO      PDMP not reviewed this encounter.   Amarachi Kotz, DO 03/21/24 1718

## 2024-03-21 NOTE — ED Triage Notes (Signed)
 Urinary frequency, lower abdominal pain, burning with urination x 1 day.

## 2024-03-21 NOTE — Discharge Instructions (Addendum)
 You have a urinary tract infection. Stop by the pharmacy to pick up your prescriptions.  Follow up with your primary care provider or return to the urgent care, if not improving.   I sent your urine for culture. Someone may call you to change antibiotics.

## 2024-03-23 LAB — URINE CULTURE: Culture: 50000 — AB

## 2024-03-25 ENCOUNTER — Ambulatory Visit: Payer: Self-pay

## 2024-03-25 NOTE — Telephone Encounter (Signed)
 Summary: urinary discomfort   Copied From CRM [id number]. Reason for Triage: The patient is calling on the community line to share that they are still experiencing the urinary discomfort and concerns that they were previously treated for roughly 1 week ago  The patient shares that their frequent urination has decreased but still continues some as well as their lower back discomfort  The patient would like to be contacted to discuss further when possible          Chief Complaint: urinary symptoms Symptoms: frequency and discomfort Frequency: constant Pertinent Negatives: Patient denies fever Disposition: [] ED /[] Urgent Care (no appt availability in office) / [] Appointment(In office/virtual)/ []  [facility]/ [] Home Care/ [] Refused Recommended Disposition /[] [facility]/ []  Follow-up with PCP Additional Notes: Pt seen in UC for UTI, started on macrobid . Pt reports persisting symptoms and would like a f/u visit. Patient does not have a  provider, declined to establish care for an acute visit. RN advising patient to follow-up with her PCP or return to urgent care for persisting symptoms.   Reason for Disposition  [1] Taking antibiotic > 72 hours (3 days) for UTI AND [2] painful urination or frequency is SAME (unchanged, not better)  Answer Assessment - Initial Assessment Questions 1. SYMPTOM: "What's the main symptom you're concerned about?" (e.g., frequency, incontinence)     Frequency 2. ONSET: "When did the  frequency   start?"     10 days  3. PAIN: "Is there any pain?" If Yes, ask: "How bad is it?" (Scale: 1-10; mild, moderate, severe)     Skipped  4. CAUSE: "What do you think is causing the symptoms?"     Uti  5. OTHER SYMPTOMS: "Do you have any other symptoms?" (e.g., blood in urine, fever, flank pain, pain with urination)     Discomfort when urinating  6. PREGNANCY: "Is there any chance you are pregnant?" "When was your last menstrual period?"      no  Protocols used: Urinary Symptoms-A-AH, Urinary Tract Infection on Antibiotic Follow-up Call - [facility]

## 2024-03-26 ENCOUNTER — Ambulatory Visit: Payer: Self-pay

## 2024-05-24 ENCOUNTER — Ambulatory Visit
Admission: RE | Admit: 2024-05-24 | Discharge: 2024-05-24 | Disposition: A | Discharge status: Home / Self Care | Source: Ambulatory Visit | Attending: Emergency Medicine | Admitting: Emergency Medicine

## 2024-05-24 VITALS — BP 130/84 | HR 86 | Temp 97.7°F | Resp 15 | Ht 66.0 in | Wt 170.0 lb

## 2024-05-24 DIAGNOSIS — N390 Urinary tract infection, site not specified: Secondary | ICD-10-CM | POA: Diagnosis not present

## 2024-05-24 LAB — URINALYSIS, W/ REFLEX TO CULTURE (INFECTION SUSPECTED)
Bilirubin Urine: NEGATIVE
Glucose, UA: NEGATIVE mg/dL
Ketones, ur: NEGATIVE mg/dL
Nitrite: NEGATIVE
Protein, ur: NEGATIVE mg/dL
Specific Gravity, Urine: 1.015 (ref 1.005–1.030)
pH: 6 (ref 5.0–8.0)

## 2024-05-24 MED ORDER — PHENAZOPYRIDINE HCL 200 MG PO TABS: 200.0000 mg | ORAL_TABLET | Freq: Three times a day (TID) | ORAL | 0 refills | Status: DC

## 2024-05-24 MED ORDER — NITROFURANTOIN MONOHYD MACRO 100 MG PO CAPS: 100.0000 mg | ORAL_CAPSULE | Freq: Two times a day (BID) | ORAL | 0 refills | Status: DC

## 2024-05-24 NOTE — ED Triage Notes (Signed)
 Patient reports urinary frequency and dysuria that started last night.

## 2024-05-24 NOTE — ED Provider Notes (Signed)
 [facility]    CSN: [id number] Arrival date & time: 05/24/24  1325      History   Chief Complaint Chief Complaint  Patient presents with   Dysuria    HPI Sarah Bird is a 88 y.o. female.   HPI  88 year old female with past medical history significant for hypertension and cervical dysplasia presents for evaluation of UTI symptoms which began last night.  She is reporting pain with urination along with urinary urgency and frequency.  Also some low back pain and some mild suprapubic pain.  No fever, nausea, vomiting, or blood in her urine.  She reports that here in the last several years she has had recurrent urinary tract infections.  Past Medical History:  Diagnosis Date   Cancer [facility]) 1966   Cervical   Hypertension     Patient Active Problem List   Diagnosis Date Noted   Family history of colon cancer 09/22/2015   Encounter for screening colonoscopy 09/22/2015    Past Surgical History:  Procedure Laterality Date   ABDOMINAL HYSTERECTOMY  1966   CERVICAL CONIZATION W/BX     COLONOSCOPY  09/2007   Dr Dessa   COLONOSCOPY WITH PROPOFOL  N/A 09/29/2015   Procedure: COLONOSCOPY WITH PROPOFOL ;  Surgeon: Reyes LELON Dessa, MD;  Location: [facility] ENDOSCOPY;  Service: Endoscopy;  Laterality: N/A;   EYE SURGERY Bilateral 4 years ago   cataracts   TONSILLECTOMY  1945    OB History     Gravida  2   Para  2   Term      Preterm      AB      Living         SAB      IAB      Ectopic      Multiple      Live Births           Obstetric Comments  1st Menstrual Cycle:  15  1st Pregnancy:  24           Home Medications    Prior to Admission medications   Medication Sig Start Date End Date Taking? Authorizing Provider  nitrofurantoin , macrocrystal-monohydrate, (MACROBID ) 100 MG capsule Take 1 capsule (100 mg total) by mouth 2 (two) times daily. 05/24/24  Yes Bernardino Ditch, NP  phenazopyridine  (PYRIDIUM ) 200 MG tablet Take 1 tablet (200 mg  total) by mouth 3 (three) times daily. 05/24/24  Yes Bernardino Ditch, NP  azelastine (ASTELIN) 0.1 % nasal spray Place into the nose. 02/21/23 02/21/24  [provider]  cyanocobalamin (VITAMIN B12) 1000 MCG tablet Take by mouth.    [provider]  diltiazem (CARDIZEM CD) 120 MG 24 hr capsule Take by mouth. 03/03/23 03/21/24  [provider]  ELIQUIS 5 MG TABS tablet Take 5 mg by mouth 2 (two) times daily.    [provider]  furosemide (LASIX) 20 MG tablet Take by mouth. 02/15/22   [provider]  Multiple Vitamins-Minerals (ICAPS AREDS FORMULA PO) Take by mouth daily.    [provider]  potassium chloride (KLOR-CON) 10 MEQ tablet Take 10 mEq by mouth daily.    [provider]    Family History Family History  Problem Relation Age of Onset   Colon cancer Mother 75   Colon cancer Brother 29   Breast cancer Maternal Aunt        3 aunts age 41's   Breast cancer Maternal Grandmother        mat  great gm    Social History Social History   Tobacco Use   Smoking status: Never   Smokeless tobacco: Never  Vaping Use   Vaping status: Never Used  Substance Use Topics   Alcohol use: Yes    Alcohol/week: 7.0 standard drinks of alcohol    Types: 7 Glasses of wine per week   Drug use: No     Allergies   Losartan, Other, Risedronate, and Sulfa antibiotics   Review of Systems Review of Systems  Constitutional:  Negative for fever.  Gastrointestinal:  Positive for abdominal pain. Negative for nausea and vomiting.  Genitourinary:  Positive for dysuria, frequency and urgency. Negative for hematuria.  Musculoskeletal:  Positive for back pain.     Physical Exam Triage Vital Signs ED Triage Vitals  Encounter Vitals Group     BP      Girls Systolic BP Percentile      Girls Diastolic BP Percentile      Boys Systolic BP Percentile      Boys Diastolic BP Percentile      Pulse      Resp      Temp      Temp src      SpO2       Weight      Height      Head Circumference      Peak Flow      Pain Score      Pain Loc      Pain Education      Exclude from Growth Chart    No data found.  Updated Vital Signs BP 130/84 (BP Location: Left Arm)   Pulse 86   Temp 97.7 F (36.5 C) (Oral)   Resp 15   Ht 5' 6 (1.676 m)   Wt 169 lb 15.6 oz (77.1 kg)   SpO2 95%   BMI 27.43 kg/m   Visual Acuity Right Eye Distance:   Left Eye Distance:   Bilateral Distance:    Right Eye Near:   Left Eye Near:    Bilateral Near:     Physical Exam Vitals and nursing note reviewed.  Constitutional:      Appearance: Normal appearance. She is not ill-appearing.  HENT:     Head: Normocephalic and atraumatic.  Cardiovascular:     Rate and Rhythm: Normal rate and regular rhythm.     Pulses: Normal pulses.     Heart sounds: Normal heart sounds. No murmur heard.    No friction rub. No gallop.  Pulmonary:     Effort: Pulmonary effort is normal.     Breath sounds: Normal breath sounds. No wheezing, rhonchi or rales.  Abdominal:     Tenderness: There is no right CVA tenderness or left CVA tenderness.  Skin:    General: Skin is warm and dry.     Capillary Refill: Capillary refill takes less than 2 seconds.     Findings: No rash.  Neurological:     General: No focal deficit present.     Mental Status: She is alert and oriented to person, place, and time.      UC Treatments / Results  Labs (all labs ordered are listed, but only abnormal results are displayed) Labs Reviewed  URINALYSIS, W/ REFLEX TO CULTURE (INFECTION SUSPECTED) - Abnormal; Notable for the following components:      Result Value   APPearance HAZY (*)    Hgb urine dipstick MODERATE (*)    Leukocytes,Ua MODERATE (*)  Non Squamous Epithelial PRESENT (*)    Bacteria, UA MANY (*)    All other components within normal limits  URINE CULTURE    EKG   Radiology No results found.  Procedures Procedures (including critical care time)  Medications  Ordered in UC Medications - No data to display  Initial Impression / Assessment and Plan / UC Course  I have reviewed the triage vital signs and the nursing notes.  Pertinent labs & imaging results that were available during my care of the patient were reviewed by me and considered in my medical decision making (see chart for details).   Patient is a pleasant, nontoxic-appearing 88 year old female presenting for evaluation of UTI symptoms which began last night.  I asked the patient about the history of urinary tract infections and she reports that over the last several years she has had recurrent urinary tract infections.  I inquired as to whether or not her PCP had discussed using topical vaginal estrogen to help prevent recurrence of UTIs and she reports that her PCP has not discussed that with her.  I offered that she should probably talk to her PCP about that potential pharmacotherapy to prevent recurrent urinary tract infections.  I will order a urinalysis to assess for the presence of UTI.  Urinalysis has a appearance with moderate hemoglobin and moderate leukocyte esterase.  Negative for nitrates, protein, ketones, or glucose.  Reflex microscopy shows none the squamous epithelials are present, 21-50 WBCs, 6-10 RBCs, many bacteria, and WBC clumps.  I will order urine culture.  I will discharge patient home on Macrobid  100 mg twice daily for 5 days for treatment of UTI along with Pyridium  every 8 hours as needed for urinary discomfort.  Patient's last 2 urine cultures grew out E. coli which were pansensitive.  Return precautions reviewed   Final Clinical Impressions(s) / UC Diagnoses   Final diagnoses:  Lower urinary tract infectious disease     Discharge Instructions      Take the Macrobid  twice daily for 5 days with food for treatment of urinary tract infection.  Use the Pyridium  every 8 hours as needed for urinary discomfort.  This will turn your urine a bright  red-orange.  Increase your oral fluid intake so that you increase your urine production and or flushing your urinary system.  Take an over-the-counter probiotic, such as Culturelle-Align-Activia, 1 hour after each dose of antibiotic to prevent diarrhea or yeast infections from forming.  We will culture urine and change the antibiotics if necessary.  Return for reevaluation, or see your primary care provider, for any new or worsening symptoms.      ED Prescriptions     Medication Sig Dispense Auth. Provider   nitrofurantoin , macrocrystal-monohydrate, (MACROBID ) 100 MG capsule Take 1 capsule (100 mg total) by mouth 2 (two) times daily. 10 capsule Bernardino Ditch, NP   phenazopyridine  (PYRIDIUM ) 200 MG tablet Take 1 tablet (200 mg total) by mouth 3 (three) times daily. 6 tablet Bernardino Ditch, NP      PDMP not reviewed this encounter.   Bernardino Ditch, NP 05/24/24 [phone]

## 2024-05-24 NOTE — Discharge Instructions (Addendum)

## 2024-05-27 ENCOUNTER — Ambulatory Visit (HOSPITAL_COMMUNITY): Payer: Self-pay

## 2024-05-27 LAB — URINE CULTURE: Culture: >=100000 — AB

## 2024-05-29 ENCOUNTER — Ambulatory Visit
Admission: EM | Admit: 2024-05-29 | Discharge: 2024-05-29 | Disposition: A | Discharge status: Home / Self Care | Attending: Family Medicine | Admitting: Family Medicine

## 2024-05-29 ENCOUNTER — Encounter: Payer: Self-pay | Admitting: Emergency Medicine

## 2024-05-29 DIAGNOSIS — N3000 Acute cystitis without hematuria: Secondary | ICD-10-CM | POA: Diagnosis present

## 2024-05-29 LAB — URINALYSIS, W/ REFLEX TO CULTURE (INFECTION SUSPECTED)
Bilirubin Urine: NEGATIVE
Glucose, UA: NEGATIVE mg/dL
Hgb urine dipstick: NEGATIVE
Ketones, ur: NEGATIVE mg/dL
Nitrite: NEGATIVE
Protein, ur: NEGATIVE mg/dL
Specific Gravity, Urine: <1.005 — ABNORMAL LOW (ref 1.005–1.030)
pH: 6 (ref 5.0–8.0)

## 2024-05-29 MED ORDER — CEFDINIR 300 MG PO CAPS: 300.0000 mg | ORAL_CAPSULE | Freq: Two times a day (BID) | ORAL | 0 refills | Status: DC

## 2024-05-29 NOTE — ED Triage Notes (Signed)
 Pt was seen 05/24/24 for urinary frequency and dysuria and she is not better. She also has lower abdominal pain.

## 2024-05-29 NOTE — ED Provider Notes (Signed)
 [facility]    CSN: [id number] Arrival date & time: 05/29/24  1157      History   Chief Complaint Chief Complaint  Patient presents with   Urinary Frequency     HPI HPI SRIHITHA TAGLIAFERRI is a 88 y.o. female.    Merlynn DELENA Cassette presents for continued urinary symptoms. She was here last Friday for similar sx.  Has lower abdominal pain, urinary frequency with urinary urgency. Symptoms are not resolving. No vaginal discharge or dysuria.  Finished her antibiotics yesterday and her symptoms returned.  States she had to cancel her lunch appointment with her granddaughter in order to go back to the urgent care.  .      Past Medical History:  Diagnosis Date   Cancer [facility]) 1966   Cervical   Hypertension     Patient Active Problem List   Diagnosis Date Noted   Family history of colon cancer 09/22/2015   Encounter for screening colonoscopy 09/22/2015    Past Surgical History:  Procedure Laterality Date   ABDOMINAL HYSTERECTOMY  1966   CERVICAL CONIZATION W/BX     COLONOSCOPY  09/2007   Dr Dessa   COLONOSCOPY WITH PROPOFOL  N/A 09/29/2015   Procedure: COLONOSCOPY WITH PROPOFOL ;  Surgeon: Reyes LELON Dessa, MD;  Location: [facility] ENDOSCOPY;  Service: Endoscopy;  Laterality: N/A;   EYE SURGERY Bilateral 4 years ago   cataracts   TONSILLECTOMY  1945    OB History     Gravida  2   Para  2   Term      Preterm      AB      Living         SAB      IAB      Ectopic      Multiple      Live Births           Obstetric Comments  1st Menstrual Cycle:  15  1st Pregnancy:  24           Home Medications    Prior to Admission medications   Medication Sig Start Date End Date Taking? Authorizing Provider  cefdinir  (OMNICEF ) 300 MG capsule Take 1 capsule (300 mg total) by mouth 2 (two) times daily. 05/29/24  Yes Marjan Rosman, DO  azelastine (ASTELIN) 0.1 % nasal spray Place into the nose. 02/21/23 02/21/24  [provider]  cyanocobalamin  (VITAMIN B12) 1000 MCG tablet Take by mouth.    [provider]  diltiazem (CARDIZEM CD) 120 MG 24 hr capsule Take by mouth. 03/03/23 03/21/24  [provider]  ELIQUIS 5 MG TABS tablet Take 5 mg by mouth 2 (two) times daily.    [provider]  furosemide (LASIX) 20 MG tablet Take by mouth. 02/15/22   [provider]  Multiple Vitamins-Minerals (ICAPS AREDS FORMULA PO) Take by mouth daily.    [provider]  nitrofurantoin , macrocrystal-monohydrate, (MACROBID ) 100 MG capsule Take 1 capsule (100 mg total) by mouth 2 (two) times daily. 05/24/24   Bernardino Ditch, NP  phenazopyridine  (PYRIDIUM ) 200 MG tablet Take 1 tablet (200 mg total) by mouth 3 (three) times daily. 05/24/24   Bernardino Ditch, NP  potassium chloride (KLOR-CON) 10 MEQ tablet Take 10 mEq by mouth daily.    [provider]    Family History Family History  Problem Relation Age of Onset   Colon cancer Mother 20   Colon cancer Brother 12   Breast cancer Maternal Aunt  3 aunts age 85's   Breast cancer Maternal Grandmother        mat great gm    Social History Social History   Tobacco Use   Smoking status: Never   Smokeless tobacco: Never  Vaping Use   Vaping status: Never Used  Substance Use Topics   Alcohol use: Yes    Alcohol/week: 7.0 standard drinks of alcohol    Types: 7 Glasses of wine per week   Drug use: No     Allergies   Losartan, Other, Risedronate, and Sulfa antibiotics   Review of Systems Review of Systems: :negative unless otherwise stated in HPI.      Physical Exam Triage Vital Signs ED Triage Vitals  Encounter Vitals Group     BP 05/29/24 1222 133/86     Girls Systolic BP Percentile --      Girls Diastolic BP Percentile --      Boys Systolic BP Percentile --      Boys Diastolic BP Percentile --      Pulse Rate 05/29/24 1222 77     Resp 05/29/24 1222 16     Temp 05/29/24 1222 97.8 F (36.6 C)     Temp Source 05/29/24 1222 Oral      SpO2 05/29/24 1222 96 %     Weight --      Height --      Head Circumference --      Peak Flow --      Pain Score 05/29/24 1221 1     Pain Loc --      Pain Education --      Exclude from Growth Chart --    No data found.  Updated Vital Signs BP 133/86 (BP Location: Right Arm)   Pulse 77   Temp 97.8 F (36.6 C) (Oral)   Resp 16   SpO2 96%   Visual Acuity Right Eye Distance:   Left Eye Distance:   Bilateral Distance:    Right Eye Near:   Left Eye Near:    Bilateral Near:     Physical Exam GEN: well appearing female in no acute distress  CVS: well perfused  RESP: speaking in full sentences without pause  ABD: soft, non-tender, non-distended, no palpable masses, no CVA tenderness bilaterally   UC Treatments / Results  Labs (all labs ordered are listed, but only abnormal results are displayed) Labs Reviewed  URINALYSIS, W/ REFLEX TO CULTURE (INFECTION SUSPECTED) - Abnormal; Notable for the following components:      Result Value   Specific Gravity, Urine <1.005 (*)    Leukocytes,Ua TRACE (*)    Bacteria, UA RARE (*)    All other components within normal limits  URINE CULTURE    EKG   Radiology No results found.  Procedures Procedures (including critical care time)  Medications Ordered in UC Medications - No data to display  Initial Impression / Assessment and Plan / UC Course  I have reviewed the triage vital signs and the nursing notes.  Pertinent labs & imaging results that were available during my care of the patient were reviewed by me and considered in my medical decision making (see chart for details).        Patient is a 88 y.o. female  who presents for return of her urinary symptoms after completing her antibiotics yesterday. Overall patient is well-appearing and afebrile.  Vital signs stable.  Urinalysis with trace leukocyte esterase and rare bacteria without hematuria.  She may have an  unresolved UTI.  Treat with Cefdinir  2 times daily for 7  days.  Urine culture obtained.  Follow-up sensitivities and change antibiotics, if needed.   - Return precautions including abdominal pain, fever, chills, nausea, or vomiting given. Follow-up,  if symptoms not improving or getting worse. Discussed MDM, treatment plan and plan for follow-up with patient who agrees with plan.        Final Clinical Impressions(s) / UC Diagnoses   Final diagnoses:  Acute cystitis without hematuria     Discharge Instructions      Stop by the pharmacy to pick up your prescriptions.  Follow up with your primary care provider or return to the urgent care, if not improving.       ED Prescriptions     Medication Sig Dispense Auth. Provider   cefdinir  (OMNICEF ) 300 MG capsule Take 1 capsule (300 mg total) by mouth 2 (two) times daily. 14 capsule Inger Wiest, DO      PDMP not reviewed this encounter.   Reinaldo Helt, DO 05/29/24 1307

## 2024-05-29 NOTE — Discharge Instructions (Signed)
 Stop by the pharmacy to pick up your prescriptions.  Follow up with your primary care provider or return to the urgent care, if not improving.

## 2024-05-30 LAB — URINE CULTURE: Culture: NO GROWTH

## 2024-05-31 ENCOUNTER — Ambulatory Visit (HOSPITAL_COMMUNITY): Payer: Self-pay

## 2024-09-06 ENCOUNTER — Ambulatory Visit
Admission: EM | Admit: 2024-09-06 | Discharge: 2024-09-06 | Disposition: A | Discharge status: Home / Self Care | Attending: Physician Assistant | Admitting: Physician Assistant

## 2024-09-06 DIAGNOSIS — N3000 Acute cystitis without hematuria: Secondary | ICD-10-CM | POA: Diagnosis present

## 2024-09-06 LAB — URINALYSIS, W/ REFLEX TO CULTURE (INFECTION SUSPECTED)
Bilirubin Urine: NEGATIVE
Glucose, UA: NEGATIVE mg/dL
Ketones, ur: NEGATIVE mg/dL
Nitrite: NEGATIVE
Protein, ur: 30 mg/dL — AB
Specific Gravity, Urine: 1.01 (ref 1.005–1.030)
WBC, UA: >50 WBC/hpf (ref 0–5)
pH: 6 (ref 5.0–8.0)

## 2024-09-06 MED ORDER — CEFDINIR 300 MG PO CAPS: 300.0000 mg | ORAL_CAPSULE | Freq: Two times a day (BID) | ORAL | 0 refills | Status: AC

## 2024-09-06 NOTE — ED Triage Notes (Signed)
 Pt c/o possible UTI x1day  Pt states that she is having urinary urgency and frequency

## 2024-09-06 NOTE — Discharge Instructions (Signed)

## 2024-09-06 NOTE — ED Provider Notes (Signed)
 [facility]    CSN: [id number] Arrival date & time: 09/06/24  9082      History   Chief Complaint Chief Complaint  Patient presents with   Urinary Tract Infection    HPI Sarah Bird is a 88 y.o. female presenting for onset of dysuria, frequency and urgency last night.  Denies fever, fatigue, abdominal pain, flank pain, hematuria, vaginal itching.  History of UTIs.  Patient reports being treated with Macrobid  a few months ago.  She says it did not help and then she was switched to cefdinir  which was helpful.  She would like to take the antibiotic again if she has UTI.  HPI  Past Medical History:  Diagnosis Date   Cancer [facility]) 1966   Cervical   Hypertension     Patient Active Problem List   Diagnosis Date Noted   Family history of colon cancer 09/22/2015   Encounter for screening colonoscopy 09/22/2015    Past Surgical History:  Procedure Laterality Date   ABDOMINAL HYSTERECTOMY  1966   CERVICAL CONIZATION W/BX     COLONOSCOPY  09/2007   Dr Dessa   COLONOSCOPY WITH PROPOFOL  N/A 09/29/2015   Procedure: COLONOSCOPY WITH PROPOFOL ;  Surgeon: Reyes LELON Dessa, MD;  Location: [facility] ENDOSCOPY;  Service: Endoscopy;  Laterality: N/A;   EYE SURGERY Bilateral 4 years ago   cataracts   TONSILLECTOMY  1945    OB History     Gravida  2   Para  2   Term      Preterm      AB      Living         SAB      IAB      Ectopic      Multiple      Live Births           Obstetric Comments  1st Menstrual Cycle:  15  1st Pregnancy:  24           Home Medications    Prior to Admission medications   Medication Sig Start Date End Date Taking? Authorizing Provider  cefdinir  (OMNICEF ) 300 MG capsule Take 1 capsule (300 mg total) by mouth 2 (two) times daily for 7 days. 09/06/24 09/13/24 Yes Arvis Huxley B, PA-C  cyanocobalamin (VITAMIN B12) 1000 MCG tablet Take by mouth.   Yes [provider]  ELIQUIS 5 MG TABS tablet Take 5 mg by  mouth 2 (two) times daily.   Yes [provider]  furosemide (LASIX) 20 MG tablet Take by mouth. 02/15/22  Yes [provider]  Multiple Vitamins-Minerals (ICAPS AREDS FORMULA PO) Take by mouth daily.   Yes [provider]  potassium chloride (KLOR-CON) 10 MEQ tablet Take 10 mEq by mouth daily.   Yes [provider]  azelastine (ASTELIN) 0.1 % nasal spray Place into the nose. 02/21/23 02/21/24  [provider]  diltiazem (CARDIZEM CD) 120 MG 24 hr capsule Take by mouth. 03/03/23 03/21/24  [provider]    Family History Family History  Problem Relation Age of Onset   Colon cancer Mother 85   Colon cancer Brother 29   Breast cancer Maternal Aunt        3 aunts age 42's   Breast cancer Maternal Grandmother        mat great gm    Social History Social History   Tobacco Use   Smoking status: Never   Smokeless tobacco: Never  Vaping Use  Vaping status: Never Used  Substance Use Topics   Alcohol use: Yes    Alcohol/week: 7.0 standard drinks of alcohol    Types: 7 Glasses of wine per week   Drug use: No     Allergies   Losartan, Other, Risedronate, and Sulfa antibiotics   Review of Systems Review of Systems  Constitutional:  Negative for chills, fatigue and fever.  Gastrointestinal:  Negative for abdominal pain, diarrhea, nausea and vomiting.  Genitourinary:  Positive for dysuria, frequency and urgency. Negative for decreased urine volume, flank pain, hematuria, pelvic pain, vaginal bleeding, vaginal discharge and vaginal pain.  Musculoskeletal:  Negative for back pain.  Skin:  Negative for rash.     Physical Exam Triage Vital Signs ED Triage Vitals  Encounter Vitals Group     BP      Girls Systolic BP Percentile      Girls Diastolic BP Percentile      Boys Systolic BP Percentile      Boys Diastolic BP Percentile      Pulse      Resp      Temp      Temp src      SpO2      Weight      Height      Head  Circumference      Peak Flow      Pain Score      Pain Loc      Pain Education      Exclude from Growth Chart    No data found.  Updated Vital Signs BP 137/82 (BP Location: Left Arm)   Pulse 82   Temp 98.5 F (36.9 C) (Oral)   Wt 177 lb 11.2 oz (80.6 kg)   SpO2 94%   BMI 28.68 kg/m     Physical Exam Vitals and nursing note reviewed.  Constitutional:      General: She is not in acute distress.    Appearance: Normal appearance. She is not ill-appearing or toxic-appearing.  HENT:     Head: Normocephalic and atraumatic.  Eyes:     General: No scleral icterus.       Right eye: No discharge.        Left eye: No discharge.     Conjunctiva/sclera: Conjunctivae normal.  Cardiovascular:     Rate and Rhythm: Normal rate and regular rhythm.     Heart sounds: Normal heart sounds.  Pulmonary:     Effort: Pulmonary effort is normal. No respiratory distress.     Breath sounds: Normal breath sounds.  Abdominal:     Palpations: Abdomen is soft.     Tenderness: There is no abdominal tenderness. There is no right CVA tenderness or left CVA tenderness.  Musculoskeletal:     Cervical back: Neck supple.  Skin:    General: Skin is dry.  Neurological:     General: No focal deficit present.     Mental Status: She is alert. Mental status is at baseline.     Motor: No weakness.     Gait: Gait normal.  Psychiatric:        Mood and Affect: Mood normal.        Behavior: Behavior normal.      UC Treatments / Results  Labs (all labs ordered are listed, but only abnormal results are displayed) Labs Reviewed  URINALYSIS, W/ REFLEX TO CULTURE (INFECTION SUSPECTED) - Abnormal; Notable for the following components:      Result Value   APPearance  HAZY (*)    Hgb urine dipstick LARGE (*)    Protein, ur 30 (*)    Leukocytes,Ua LARGE (*)    Bacteria, UA FEW (*)    All other components within normal limits  URINE CULTURE    EKG   Radiology No results found.  Procedures Procedures  (including critical care time)  Medications Ordered in UC Medications - No data to display  Initial Impression / Assessment and Plan / UC Course  I have reviewed the triage vital signs and the nursing notes.  Pertinent labs & imaging results that were available during my care of the patient were reviewed by me and considered in my medical decision making (see chart for details).   88 year old female presents for dysuria, frequency and urgency began yesterday.  No fever, abdominal pain or flank pain.  UA shows hazy appearance with large hemoglobin, protein, large leukocytes and bacteria.  Urine sent for culture.  Reviewed previous urine culture from a few months ago which was pansensitive.  Sent cefdinir  to pharmacy.  Will amend treatment based on culture result if necessary.   Final Clinical Impressions(s) / UC Diagnoses   Final diagnoses:  Acute cystitis without hematuria     Discharge Instructions      UTI: Based on either symptoms or urinalysis, you may have a urinary tract infection. We will send the urine for culture and call with results in a few days. Begin antibiotics at this time. Your symptoms should be much improved over the next 2-3 days. Increase rest and fluid intake. If for some reason symptoms are worsening or not improving after a couple of days or the urine culture determines the antibiotics you are taking will not treat the infection, the antibiotics may be changed. Return or go to ER for fever, back pain, worsening urinary pain, discharge, increased blood in urine. May take Tylenol or Motrin OTC for pain relief or consider AZO if no contraindications      ED Prescriptions     Medication Sig Dispense Auth. Provider   cefdinir  (OMNICEF ) 300 MG capsule Take 1 capsule (300 mg total) by mouth 2 (two) times daily for 7 days. 14 capsule Arvis Jolan NOVAK, PA-C      PDMP not reviewed this encounter.   Arvis Jolan NOVAK, PA-C 09/06/24 1009

## 2024-09-08 LAB — URINE CULTURE: Culture: >=100000 — AB

## 2024-09-09 ENCOUNTER — Ambulatory Visit (HOSPITAL_COMMUNITY): Payer: Self-pay

## 2024-09-21 ENCOUNTER — Ambulatory Visit
Admission: EM | Admit: 2024-09-21 | Discharge: 2024-09-21 | Disposition: A | Discharge status: Home / Self Care | Attending: Emergency Medicine | Admitting: Emergency Medicine

## 2024-09-21 ENCOUNTER — Encounter: Payer: Self-pay | Admitting: Emergency Medicine

## 2024-09-21 DIAGNOSIS — R300 Dysuria: Secondary | ICD-10-CM | POA: Diagnosis present

## 2024-09-21 DIAGNOSIS — N390 Urinary tract infection, site not specified: Secondary | ICD-10-CM | POA: Diagnosis present

## 2024-09-21 LAB — POCT URINE DIPSTICK
Bilirubin, UA: NEGATIVE
Glucose, UA: NEGATIVE mg/dL
Ketones, POC UA: NEGATIVE mg/dL
Nitrite, UA: NEGATIVE
POC PROTEIN,UA: 30 — AB
Spec Grav, UA: 1.01 (ref 1.010–1.025)
Urobilinogen, UA: 0.2 U/dL
pH, UA: 7 (ref 5.0–8.0)

## 2024-09-21 MED ORDER — PHENAZOPYRIDINE HCL 200 MG PO TABS: 200.0000 mg | ORAL_TABLET | Freq: Three times a day (TID) | ORAL | 0 refills | Status: DC

## 2024-09-21 MED ORDER — CIPROFLOXACIN HCL 500 MG PO TABS: 500.0000 mg | ORAL_TABLET | Freq: Two times a day (BID) | ORAL | 0 refills | Status: DC

## 2024-09-21 NOTE — ED Triage Notes (Signed)
 Patient states that she was treated for a UTI on 09/06/24.  Patient states that she felt better for couple of days but her symptoms started back last night dysuria and urinary frequency.

## 2024-09-21 NOTE — ED Provider Notes (Signed)
 [facility]    CSN: [id number] Arrival date & time: 09/21/24  0801      History   Chief Complaint Chief Complaint  Patient presents with   Dysuria    HPI Sarah Bird is a 88 y.o. female.   HPI  88 year old female with past medical hist significant for cervical cancer and hypertension presents for evaluation of UTI symptoms that include dysuria and urinary frequency.  Also suprapubic pain.  She was seen for UTI on 09/06/2024 and discharged home on cefdinir .  Her culture grew out Morganella morganii which was not susceptible to the cefdinir .  Several attempts were made to reach the patient but she reports she never got the message.  She denies any fever, nausea or vomiting, or low back pain.  Past Medical History:  Diagnosis Date   Cancer [facility]) 1966   Cervical   Hypertension     Patient Active Problem List   Diagnosis Date Noted   Family history of colon cancer 09/22/2015   Encounter for screening colonoscopy 09/22/2015    Past Surgical History:  Procedure Laterality Date   ABDOMINAL HYSTERECTOMY  1966   CERVICAL CONIZATION W/BX     COLONOSCOPY  09/2007   Dr Dessa   COLONOSCOPY WITH PROPOFOL  N/A 09/29/2015   Procedure: COLONOSCOPY WITH PROPOFOL ;  Surgeon: Reyes LELON Dessa, MD;  Location: [facility] ENDOSCOPY;  Service: Endoscopy;  Laterality: N/A;   EYE SURGERY Bilateral 4 years ago   cataracts   TONSILLECTOMY  1945    OB History     Gravida  2   Para  2   Term      Preterm      AB      Living         SAB      IAB      Ectopic      Multiple      Live Births           Obstetric Comments  1st Menstrual Cycle:  15  1st Pregnancy:  24           Home Medications    Prior to Admission medications   Medication Sig Start Date End Date Taking? Authorizing Provider  ciprofloxacin (CIPRO) 500 MG tablet Take 1 tablet (500 mg total) by mouth 2 (two) times daily for 7 days. 09/21/24 09/28/24 Yes Bernardino Ditch, NP  phenazopyridine   (PYRIDIUM ) 200 MG tablet Take 1 tablet (200 mg total) by mouth 3 (three) times daily. 09/21/24  Yes Bernardino Ditch, NP  azelastine (ASTELIN) 0.1 % nasal spray Place into the nose. 02/21/23 02/21/24  [provider]  cyanocobalamin (VITAMIN B12) 1000 MCG tablet Take by mouth.    [provider]  diltiazem (CARDIZEM CD) 120 MG 24 hr capsule Take by mouth. 03/03/23 03/21/24  [provider]  ELIQUIS 5 MG TABS tablet Take 5 mg by mouth 2 (two) times daily.    [provider]  furosemide (LASIX) 20 MG tablet Take by mouth. 02/15/22   [provider]  Multiple Vitamins-Minerals (ICAPS AREDS FORMULA PO) Take by mouth daily.    [provider]  potassium chloride (KLOR-CON) 10 MEQ tablet Take 10 mEq by mouth daily.    [provider]    Family History Family History  Problem Relation Age of Onset   Colon cancer Mother 30   Colon cancer Brother 59   Breast cancer Maternal Aunt        3 aunts age 19's  Breast cancer Maternal Grandmother        mat great gm    Social History Social History   Tobacco Use   Smoking status: Never   Smokeless tobacco: Never  Vaping Use   Vaping status: Never Used  Substance Use Topics   Alcohol use: Yes    Alcohol/week: 7.0 standard drinks of alcohol    Types: 7 Glasses of wine per week   Drug use: No     Allergies   Losartan, Other, Risedronate, and Sulfa antibiotics   Review of Systems Review of Systems  Constitutional:  Negative for fever.  Gastrointestinal:  Positive for abdominal pain. Negative for nausea and vomiting.  Genitourinary:  Positive for dysuria, frequency and urgency. Negative for hematuria.  Musculoskeletal:  Negative for back pain.     Physical Exam Triage Vital Signs ED Triage Vitals  Encounter Vitals Group     BP      Girls Systolic BP Percentile      Girls Diastolic BP Percentile      Boys Systolic BP Percentile      Boys Diastolic BP Percentile      Pulse      Resp       Temp      Temp src      SpO2      Weight      Height      Head Circumference      Peak Flow      Pain Score      Pain Loc      Pain Education      Exclude from Growth Chart    No data found.  Updated Vital Signs BP (!) 133/93 (BP Location: Left Arm)   Pulse 76   Temp 98 F (36.7 C) (Oral)   Resp 14   Ht 5' 6 (1.676 m)   Wt 177 lb 11.1 oz (80.6 kg)   SpO2 97%   BMI 28.68 kg/m   Visual Acuity Right Eye Distance:   Left Eye Distance:   Bilateral Distance:    Right Eye Near:   Left Eye Near:    Bilateral Near:     Physical Exam Vitals and nursing note reviewed.  Constitutional:      Appearance: Normal appearance. She is not ill-appearing.  HENT:     Head: Normocephalic and atraumatic.  Cardiovascular:     Rate and Rhythm: Normal rate and regular rhythm.     Pulses: Normal pulses.     Heart sounds: Normal heart sounds. No murmur heard.    No friction rub. No gallop.  Pulmonary:     Effort: Pulmonary effort is normal.     Breath sounds: Normal breath sounds. No wheezing, rhonchi or rales.  Abdominal:     General: Abdomen is flat.     Palpations: Abdomen is soft.     Tenderness: There is no abdominal tenderness. There is no right CVA tenderness, left CVA tenderness, guarding or rebound.  Skin:    General: Skin is warm and dry.     Capillary Refill: Capillary refill takes less than 2 seconds.     Findings: No rash.  Neurological:     General: No focal deficit present.     Mental Status: She is alert and oriented to person, place, and time.      UC Treatments / Results  Labs (all labs ordered are listed, but only abnormal results are displayed) Labs Reviewed  POCT URINE DIPSTICK - Abnormal; Notable  for the following components:      Result Value   Clarity, UA cloudy (*)    Blood, UA large (*)    POC PROTEIN,UA =30 (*)    Leukocytes, UA Large (3+) (*)    All other components within normal limits  URINE CULTURE    EKG   Radiology No  results found.  Procedures Procedures (including critical care time)  Medications Ordered in UC Medications - No data to display  Initial Impression / Assessment and Plan / UC Course  I have reviewed the triage vital signs and the nursing notes.  Pertinent labs & imaging results that were available during my care of the patient were reviewed by me and considered in my medical decision making (see chart for details).   Patient is a nontoxic-appearing 88 year old female presenting for evaluation of recurrent UTI symptoms as outlined in HPI above.  She is complaining of suprapubic pain along with dysuria, urgency, and frequency.  However, on exam she has no CVA tenderness or tenderness with palpation of her abdomen.  Given that her urine culture grew out Morganella morganii and she was treated with cefdinir  I suspect that she most likely still has a urinary tract infection.  The sensitivity report indicates resistance to ampicillin, ampicillin sulbactam, and nitrofurantoin .  It is sensitive to Cipro, gentamicin, and Bactrim.  Urinalysis shows cloudy appearance with large RBCs, 30 protein, and large leukocyte esterase.  Negative for nitrites.  I will send urine for culture.  Given that the patient is allergic to Bactrim I will discharge patient home on Cipro 500 mg twice daily for 7 days to treat her UTI and cover for potential continued infection for the Morganella morganii.  I will also prescribe Pyridium  that she can use every 8 hours as needed for urinary discomfort.  Return precautions reviewed.   Final Clinical Impressions(s) / UC Diagnoses   Final diagnoses:  Dysuria  Lower urinary tract infectious disease     Discharge Instructions      Take the Cipro twice daily for 7 days with food for treatment of urinary tract infection.  Use the Pyridium  every 8 hours as needed for urinary discomfort.  This will turn your urine a bright red-orange.  Increase your oral fluid intake so that  you increase your urine production and or flushing your urinary system.  Take an over-the-counter probiotic, such as Culturelle-Align-Activia, 1 hour after each dose of antibiotic to prevent diarrhea or yeast infections from forming.  We will culture urine and change the antibiotics if necessary.  Return for reevaluation, or see your primary care provider, for any new or worsening symptoms.      ED Prescriptions     Medication Sig Dispense Auth. Provider   ciprofloxacin (CIPRO) 500 MG tablet Take 1 tablet (500 mg total) by mouth 2 (two) times daily for 7 days. 14 tablet Bernardino Ditch, NP   phenazopyridine  (PYRIDIUM ) 200 MG tablet Take 1 tablet (200 mg total) by mouth 3 (three) times daily. 6 tablet Bernardino Ditch, NP      PDMP not reviewed this encounter.   Bernardino Ditch, NP 09/21/24 [phone]

## 2024-09-21 NOTE — Discharge Instructions (Addendum)
Take the Cipro twice daily for 7 days with food for treatment of urinary tract infection.  Use the Pyridium every 8 hours as needed for urinary discomfort.  This will turn your urine a bright red-orange.  Increase your oral fluid intake so that you increase your urine production and or flushing your urinary system.  Take an over-the-counter probiotic, such as Culturelle-Align-Activia, 1 hour after each dose of antibiotic to prevent diarrhea or yeast infections from forming.  We will culture urine and change the antibiotics if necessary.  Return for reevaluation, or see your primary care provider, for any new or worsening symptoms.

## 2024-09-22 LAB — URINE CULTURE
Culture: <10000 — AB
Special Requests: NORMAL

## 2024-09-26 NOTE — Progress Notes (Signed)
 Assessment and Plan:  1) Follow up after Mohs surgery for [facility] of the right ala, repaired with an FTSG, healing well. 2) Bandage was changed and wound management reviewed. 3) Return to clinic in 6 weeks.  Subjective: Here for follow-up after Mohs surgery as above.  Notes no problems with healing and has no other concerns today.  Physical exam: Well-healing surgical site.

## 2024-09-27 ENCOUNTER — Inpatient Hospital Stay (HOSPITAL_COMMUNITY)

## 2024-09-27 ENCOUNTER — Emergency Department

## 2024-09-27 ENCOUNTER — Inpatient Hospital Stay (HOSPITAL_COMMUNITY)
Admission: EM | Admit: 2024-09-27 | Discharge: 2024-10-09 | DRG: 957 | Disposition: A | Discharge status: Skilled Nursing Facility | Source: Other Acute Inpatient Hospital | Attending: General Surgery | Admitting: General Surgery

## 2024-09-27 ENCOUNTER — Encounter (HOSPITAL_COMMUNITY): Payer: Self-pay

## 2024-09-27 ENCOUNTER — Ambulatory Visit (HOSPITAL_COMMUNITY): Payer: Self-pay

## 2024-09-27 ENCOUNTER — Emergency Department
Admission: EM | Admit: 2024-09-27 | Discharge: 2024-09-27 | Disposition: A | Discharge status: Other Acute Inpatient Hospital | Attending: Emergency Medicine | Admitting: Emergency Medicine

## 2024-09-27 ENCOUNTER — Other Ambulatory Visit: Payer: Self-pay

## 2024-09-27 ENCOUNTER — Inpatient Hospital Stay (HOSPITAL_COMMUNITY): Admitting: Anesthesiology

## 2024-09-27 ENCOUNTER — Encounter (HOSPITAL_COMMUNITY)
Admission: EM | Disposition: A | Discharge status: Skilled Nursing Facility | Payer: Self-pay | Source: Other Acute Inpatient Hospital

## 2024-09-27 ENCOUNTER — Encounter (HOSPITAL_COMMUNITY): Payer: Self-pay | Admitting: General Surgery

## 2024-09-27 DIAGNOSIS — Z7901 Long term (current) use of anticoagulants: Secondary | ICD-10-CM | POA: Diagnosis not present

## 2024-09-27 DIAGNOSIS — Z6829 Body mass index (BMI) 29.0-29.9, adult: Secondary | ICD-10-CM

## 2024-09-27 DIAGNOSIS — Z85828 Personal history of other malignant neoplasm of skin: Secondary | ICD-10-CM | POA: Insufficient documentation

## 2024-09-27 DIAGNOSIS — Z888 Allergy status to other drugs, medicaments and biological substances status: Secondary | ICD-10-CM

## 2024-09-27 DIAGNOSIS — Z23 Encounter for immunization: Secondary | ICD-10-CM

## 2024-09-27 DIAGNOSIS — R296 Repeated falls: Secondary | ICD-10-CM | POA: Diagnosis present

## 2024-09-27 DIAGNOSIS — Z882 Allergy status to sulfonamides status: Secondary | ICD-10-CM | POA: Diagnosis not present

## 2024-09-27 DIAGNOSIS — E871 Hypo-osmolality and hyponatremia: Secondary | ICD-10-CM | POA: Diagnosis not present

## 2024-09-27 DIAGNOSIS — R1011 Right upper quadrant pain: Secondary | ICD-10-CM | POA: Diagnosis not present

## 2024-09-27 DIAGNOSIS — S3609XA Other injury of spleen, initial encounter: Principal | ICD-10-CM | POA: Diagnosis present

## 2024-09-27 DIAGNOSIS — Z9081 Acquired absence of spleen: Principal | ICD-10-CM

## 2024-09-27 DIAGNOSIS — S0003XA Contusion of scalp, initial encounter: Secondary | ICD-10-CM | POA: Diagnosis present

## 2024-09-27 DIAGNOSIS — I482 Chronic atrial fibrillation, unspecified: Secondary | ICD-10-CM | POA: Diagnosis not present

## 2024-09-27 DIAGNOSIS — I129 Hypertensive chronic kidney disease with stage 1 through stage 4 chronic kidney disease, or unspecified chronic kidney disease: Secondary | ICD-10-CM | POA: Diagnosis not present

## 2024-09-27 DIAGNOSIS — T796XXD Traumatic ischemia of muscle, subsequent encounter: Secondary | ICD-10-CM | POA: Diagnosis not present

## 2024-09-27 DIAGNOSIS — Z741 Need for assistance with personal care: Secondary | ICD-10-CM | POA: Diagnosis present

## 2024-09-27 DIAGNOSIS — Z7409 Other reduced mobility: Secondary | ICD-10-CM | POA: Diagnosis present

## 2024-09-27 DIAGNOSIS — I959 Hypotension, unspecified: Secondary | ICD-10-CM | POA: Diagnosis not present

## 2024-09-27 DIAGNOSIS — R339 Retention of urine, unspecified: Secondary | ICD-10-CM | POA: Diagnosis present

## 2024-09-27 DIAGNOSIS — N189 Chronic kidney disease, unspecified: Secondary | ICD-10-CM | POA: Insufficient documentation

## 2024-09-27 DIAGNOSIS — W19XXXA Unspecified fall, initial encounter: Secondary | ICD-10-CM | POA: Diagnosis not present

## 2024-09-27 DIAGNOSIS — I2489 Other forms of acute ischemic heart disease: Secondary | ICD-10-CM | POA: Diagnosis present

## 2024-09-27 DIAGNOSIS — I10 Essential (primary) hypertension: Secondary | ICD-10-CM | POA: Diagnosis present

## 2024-09-27 DIAGNOSIS — S36899A Unspecified injury of other intra-abdominal organs, initial encounter: Secondary | ICD-10-CM | POA: Insufficient documentation

## 2024-09-27 DIAGNOSIS — Y92009 Unspecified place in unspecified non-institutional (private) residence as the place of occurrence of the external cause: Secondary | ICD-10-CM | POA: Diagnosis not present

## 2024-09-27 DIAGNOSIS — D649 Anemia, unspecified: Secondary | ICD-10-CM

## 2024-09-27 DIAGNOSIS — R7989 Other specified abnormal findings of blood chemistry: Secondary | ICD-10-CM | POA: Insufficient documentation

## 2024-09-27 DIAGNOSIS — E872 Acidosis, unspecified: Secondary | ICD-10-CM | POA: Diagnosis present

## 2024-09-27 DIAGNOSIS — E663 Overweight: Secondary | ICD-10-CM | POA: Diagnosis present

## 2024-09-27 DIAGNOSIS — N179 Acute kidney failure, unspecified: Secondary | ICD-10-CM | POA: Diagnosis present

## 2024-09-27 DIAGNOSIS — E876 Hypokalemia: Secondary | ICD-10-CM | POA: Diagnosis not present

## 2024-09-27 DIAGNOSIS — K661 Hemoperitoneum: Secondary | ICD-10-CM | POA: Diagnosis present

## 2024-09-27 DIAGNOSIS — D62 Acute posthemorrhagic anemia: Secondary | ICD-10-CM | POA: Diagnosis present

## 2024-09-27 DIAGNOSIS — R918 Other nonspecific abnormal finding of lung field: Secondary | ICD-10-CM

## 2024-09-27 DIAGNOSIS — W1830XA Fall on same level, unspecified, initial encounter: Secondary | ICD-10-CM | POA: Diagnosis present

## 2024-09-27 DIAGNOSIS — T796XXA Traumatic ischemia of muscle, initial encounter: Secondary | ICD-10-CM | POA: Diagnosis present

## 2024-09-27 DIAGNOSIS — Z743 Need for continuous supervision: Secondary | ICD-10-CM | POA: Diagnosis not present

## 2024-09-27 DIAGNOSIS — R911 Solitary pulmonary nodule: Secondary | ICD-10-CM | POA: Diagnosis not present

## 2024-09-27 DIAGNOSIS — I4891 Unspecified atrial fibrillation: Secondary | ICD-10-CM | POA: Diagnosis present

## 2024-09-27 DIAGNOSIS — R0789 Other chest pain: Secondary | ICD-10-CM

## 2024-09-27 DIAGNOSIS — S3600XA Unspecified injury of spleen, initial encounter: Secondary | ICD-10-CM | POA: Diagnosis present

## 2024-09-27 HISTORY — DX: Unspecified atrial fibrillation: I48.91

## 2024-09-27 HISTORY — PX: SPLENECTOMY, TOTAL: SHX788

## 2024-09-27 LAB — CBC
HCT: 36.3 % (ref 36.0–46.0)
Hemoglobin: 12.5 g/dL (ref 12.0–15.0)
MCH: 28.6 pg (ref 26.0–34.0)
MCHC: 34.4 g/dL (ref 30.0–36.0)
MCV: 83.1 fL (ref 80.0–100.0)
Platelets: 84 K/uL — ABNORMAL LOW (ref 150–400)
RBC: 4.37 MIL/uL (ref 3.87–5.11)
RDW: 14.9 % (ref 11.5–15.5)
WBC: 9.4 K/uL (ref 4.0–10.5)
nRBC: 0 % (ref 0.0–0.2)

## 2024-09-27 LAB — BASIC METABOLIC PANEL WITH GFR
Anion gap: 18 — ABNORMAL HIGH (ref 5–15)
Anion gap: 13 (ref 5–15)
BUN: 40 mg/dL — ABNORMAL HIGH (ref 8–23)
BUN: 31 mg/dL — ABNORMAL HIGH (ref 8–23)
CO2: 18 mmol/L — ABNORMAL LOW (ref 22–32)
CO2: 16 mmol/L — ABNORMAL LOW (ref 22–32)
Calcium: 8 mg/dL — ABNORMAL LOW (ref 8.9–10.3)
Calcium: 6.8 mg/dL — ABNORMAL LOW (ref 8.9–10.3)
Chloride: 99 mmol/L (ref 98–111)
Chloride: 106 mmol/L (ref 98–111)
Creatinine, Ser: 2.28 mg/dL — ABNORMAL HIGH (ref 0.44–1.00)
Creatinine, Ser: 1.48 mg/dL — ABNORMAL HIGH (ref 0.44–1.00)
GFR, Estimated: 20 mL/min — ABNORMAL LOW (ref >60)
GFR, Estimated: 34 mL/min — ABNORMAL LOW (ref >60)
Glucose, Bld: 101 mg/dL — ABNORMAL HIGH (ref 70–99)
Glucose, Bld: 134 mg/dL — ABNORMAL HIGH (ref 70–99)
Potassium: 3.9 mmol/L (ref 3.5–5.1)
Potassium: 3.2 mmol/L — ABNORMAL LOW (ref 3.5–5.1)
Sodium: 135 mmol/L (ref 135–145)
Sodium: 135 mmol/L (ref 135–145)

## 2024-09-27 LAB — CBC WITH DIFFERENTIAL/PLATELET
Abs Immature Granulocytes: 0.04 K/uL (ref 0.00–0.07)
Basophils Absolute: 0 K/uL (ref 0.0–0.1)
Basophils Relative: 0 %
Eosinophils Absolute: 0 K/uL (ref 0.0–0.5)
Eosinophils Relative: 0 %
HCT: 29.2 % — ABNORMAL LOW (ref 36.0–46.0)
Hemoglobin: 9.7 g/dL — ABNORMAL LOW (ref 12.0–15.0)
Immature Granulocytes: 0 %
Lymphocytes Relative: 8 %
Lymphs Abs: 0.7 K/uL (ref 0.7–4.0)
MCH: 29.8 pg (ref 26.0–34.0)
MCHC: 33.2 g/dL (ref 30.0–36.0)
MCV: 89.8 fL (ref 80.0–100.0)
Monocytes Absolute: 0.7 K/uL (ref 0.1–1.0)
Monocytes Relative: 8 %
Neutro Abs: 7.6 K/uL (ref 1.7–7.7)
Neutrophils Relative %: 84 %
Platelets: 159 K/uL (ref 150–400)
RBC: 3.25 MIL/uL — ABNORMAL LOW (ref 3.87–5.11)
RDW: 14.1 % (ref 11.5–15.5)
WBC: 9.1 K/uL (ref 4.0–10.5)
nRBC: 0 % (ref 0.0–0.2)

## 2024-09-27 LAB — POCT I-STAT 7, (LYTES, BLD GAS, ICA,H+H)
Acid-base deficit: 10 mmol/L — ABNORMAL HIGH (ref 0.0–2.0)
Acid-base deficit: 9 mmol/L — ABNORMAL HIGH (ref 0.0–2.0)
Bicarbonate: 16.5 mmol/L — ABNORMAL LOW (ref 20.0–28.0)
Bicarbonate: 15.8 mmol/L — ABNORMAL LOW (ref 20.0–28.0)
Calcium, Ion: 0.89 mmol/L — CL (ref 1.15–1.40)
Calcium, Ion: 1.01 mmol/L — ABNORMAL LOW (ref 1.15–1.40)
HCT: 21 % — ABNORMAL LOW (ref 36.0–46.0)
HCT: 35 % — ABNORMAL LOW (ref 36.0–46.0)
Hemoglobin: 7.1 g/dL — ABNORMAL LOW (ref 12.0–15.0)
Hemoglobin: 11.9 g/dL — ABNORMAL LOW (ref 12.0–15.0)
O2 Saturation: 100 %
O2 Saturation: 100 %
Patient temperature: 97.7
Potassium: 3.8 mmol/L (ref 3.5–5.1)
Potassium: 3.3 mmol/L — ABNORMAL LOW (ref 3.5–5.1)
Sodium: 138 mmol/L (ref 135–145)
Sodium: 137 mmol/L (ref 135–145)
TCO2: 18 mmol/L — ABNORMAL LOW (ref 22–32)
TCO2: 17 mmol/L — ABNORMAL LOW (ref 22–32)
pCO2 arterial: 38.2 mmHg (ref 32–48)
pCO2 arterial: 30.1 mmHg — ABNORMAL LOW (ref 32–48)
pH, Arterial: 7.244 — ABNORMAL LOW (ref 7.35–7.45)
pH, Arterial: 7.327 — ABNORMAL LOW (ref 7.35–7.45)
pO2, Arterial: 244 mmHg — ABNORMAL HIGH (ref 83–108)
pO2, Arterial: 450 mmHg — ABNORMAL HIGH (ref 83–108)

## 2024-09-27 LAB — TROPONIN I (HIGH SENSITIVITY)
Troponin I (High Sensitivity): 460 ng/L (ref <18)
Troponin I (High Sensitivity): 368 ng/L (ref <18)

## 2024-09-27 LAB — TYPE AND SCREEN
ABO/RH(D): O POS
Antibody Screen: NEGATIVE

## 2024-09-27 LAB — GLUCOSE, CAPILLARY: Glucose-Capillary: 137 mg/dL — ABNORMAL HIGH (ref 70–99)

## 2024-09-27 LAB — HEPATIC FUNCTION PANEL
ALT: 41 U/L (ref 0–44)
AST: 113 U/L — ABNORMAL HIGH (ref 15–41)
Albumin: 3.1 g/dL — ABNORMAL LOW (ref 3.5–5.0)
Alkaline Phosphatase: 56 U/L (ref 38–126)
Bilirubin, Direct: 0.2 mg/dL (ref 0.0–0.2)
Indirect Bilirubin: 0.1 mg/dL — ABNORMAL LOW (ref 0.3–0.9)
Total Bilirubin: 0.3 mg/dL (ref 0.0–1.2)
Total Protein: 4.8 g/dL — ABNORMAL LOW (ref 6.5–8.1)

## 2024-09-27 LAB — MRSA NEXT GEN BY PCR, NASAL: MRSA by PCR Next Gen: NOT DETECTED

## 2024-09-27 LAB — TROPONIN T, HIGH SENSITIVITY
Troponin T High Sensitivity: 269 ng/L (ref 0–19)
Troponin T High Sensitivity: 189 ng/L (ref 0–19)

## 2024-09-27 LAB — PREPARE RBC (CROSSMATCH)

## 2024-09-27 LAB — CK
Total CK: 3931 U/L — ABNORMAL HIGH (ref 38–234)
Total CK: 2391 U/L — ABNORMAL HIGH (ref 38–234)

## 2024-09-27 LAB — PROTIME-INR
INR: 1.1 (ref 0.8–1.2)
Prothrombin Time: 14.8 s (ref 11.4–15.2)

## 2024-09-27 SURGERY — SPLENECTOMY
Anesthesia: General | Site: Abdomen

## 2024-09-27 MED ORDER — ONDANSETRON 4 MG PO TBDP: 4.0000 mg | ORAL_TABLET | Freq: Four times a day (QID) | ORAL | Status: DC | PRN

## 2024-09-27 MED ORDER — MORPHINE SULFATE (PF) 2 MG/ML IV SOLN: 2.0000 mg | Freq: Once | INTRAVENOUS | Status: DC

## 2024-09-27 MED ORDER — LIDOCAINE 2% (20 MG/ML) 5 ML SYRINGE
INTRAMUSCULAR | Status: DC | PRN
Start: 2024-09-27 — End: 2024-09-27
  Administered 2024-09-27: 40 mg via INTRAVENOUS

## 2024-09-27 MED ORDER — ORAL CARE MOUTH RINSE: 15.0000 mL | OROMUCOSAL | Status: DC | PRN

## 2024-09-27 MED ORDER — SODIUM CHLORIDE 0.9 % IV SOLN: INTRAVENOUS | Status: DC

## 2024-09-27 MED ORDER — THROMBIN 20000 UNITS EX KIT
PACK | CUTANEOUS | Status: AC
  Filled 2024-09-27: qty 1

## 2024-09-27 MED ORDER — SODIUM CHLORIDE 0.9 % IV SOLN: 10.0000 mL/h | Freq: Once | INTRAVENOUS | Status: DC

## 2024-09-27 MED ORDER — SODIUM CHLORIDE 0.9 % IV BOLUS
1000.0000 mL | Freq: Once | INTRAVENOUS | Status: AC
  Administered 2024-09-27: 1000 mL via INTRAVENOUS

## 2024-09-27 MED ORDER — ONDANSETRON HCL 4 MG/2ML IJ SOLN
4.0000 mg | Freq: Four times a day (QID) | INTRAMUSCULAR | Status: DC | PRN
  Administered 2024-09-30: 4 mg via INTRAVENOUS
  Filled 2024-09-27: qty 2

## 2024-09-27 MED ORDER — PROPOFOL 1000 MG/100ML IV EMUL
INTRAVENOUS | Status: AC
Start: 2024-09-27 — End: 2024-09-27
  Filled 2024-09-27: qty 100

## 2024-09-27 MED ORDER — SUCCINYLCHOLINE CHLORIDE 200 MG/10ML IV SOSY
PREFILLED_SYRINGE | INTRAVENOUS | Status: DC | PRN
  Administered 2024-09-27: 100 mg via INTRAVENOUS

## 2024-09-27 MED ORDER — FENTANYL CITRATE (PF) 50 MCG/ML IJ SOSY: 50.0000 ug | PREFILLED_SYRINGE | INTRAMUSCULAR | Status: DC | PRN

## 2024-09-27 MED ORDER — ORAL CARE MOUTH RINSE
15.0000 mL | OROMUCOSAL | Status: DC
  Administered 2024-09-27 – 2024-09-28 (×7): 15 mL via OROMUCOSAL

## 2024-09-27 MED ORDER — FENTANYL CITRATE (PF) 250 MCG/5ML IJ SOLN
INTRAMUSCULAR | Status: AC
  Filled 2024-09-27: qty 5

## 2024-09-27 MED ORDER — DEXTROSE 5 % IN LACTATED RINGERS IV BOLUS
500.0000 mL | Freq: Once | INTRAVENOUS | Status: AC
  Administered 2024-09-27: 500 mL via INTRAVENOUS

## 2024-09-27 MED ORDER — FENTANYL CITRATE (PF) 250 MCG/5ML IJ SOLN
INTRAMUSCULAR | Status: DC | PRN
  Administered 2024-09-27: 100 ug via INTRAVENOUS
  Administered 2024-09-27: 50 ug via INTRAVENOUS

## 2024-09-27 MED ORDER — PHENYLEPHRINE HCL-NACL 20-0.9 MG/250ML-% IV SOLN
INTRAVENOUS | Status: DC | PRN
  Administered 2024-09-27: 40 ug/min via INTRAVENOUS

## 2024-09-27 MED ORDER — ALBUMIN HUMAN 5 % IV SOLN: INTRAVENOUS | Status: DC | PRN

## 2024-09-27 MED ORDER — PROTHROMBIN COMPLEX CONC HUMAN 1000 UNITS IV KIT
4418.0000 [IU] | PACK | Status: AC
  Administered 2024-09-27: 4418 [IU] via INTRAVENOUS
  Filled 2024-09-27: qty 2918

## 2024-09-27 MED ORDER — FENTANYL CITRATE (PF) 50 MCG/ML IJ SOSY
25.0000 ug | PREFILLED_SYRINGE | Freq: Once | INTRAMUSCULAR | Status: AC
  Administered 2024-09-27: 25 ug via INTRAVENOUS
  Filled 2024-09-27: qty 1

## 2024-09-27 MED ORDER — SODIUM CHLORIDE 0.9 % IV BOLUS
500.0000 mL | Freq: Once | INTRAVENOUS | Status: AC
  Administered 2024-09-27: 500 mL via INTRAVENOUS

## 2024-09-27 MED ORDER — ROCURONIUM BROMIDE 10 MG/ML (PF) SYRINGE
PREFILLED_SYRINGE | INTRAVENOUS | Status: DC | PRN
  Administered 2024-09-27: 100 mg via INTRAVENOUS

## 2024-09-27 MED ORDER — PHENYLEPHRINE 80 MCG/ML (10ML) SYRINGE FOR IV PUSH (FOR BLOOD PRESSURE SUPPORT)
PREFILLED_SYRINGE | INTRAVENOUS | Status: DC | PRN
  Administered 2024-09-27 (×4): 160 ug via INTRAVENOUS

## 2024-09-27 MED ORDER — PROTHROMBIN COMPLEX CONC HUMAN 1000 UNITS IV KIT
4287.0000 [IU] | PACK | Status: DC
  Filled 2024-09-27: qty 4287

## 2024-09-27 MED ORDER — SODIUM CHLORIDE 0.45 % IV SOLN
INTRAVENOUS | Status: DC
  Filled 2024-09-27 (×5): qty 75

## 2024-09-27 MED ORDER — POTASSIUM CHLORIDE 10 MEQ/100ML IV SOLN
10.0000 meq | INTRAVENOUS | Status: AC
  Administered 2024-09-27 – 2024-09-28 (×4): 10 meq via INTRAVENOUS
  Filled 2024-09-27 (×4): qty 100

## 2024-09-27 MED ORDER — ETOMIDATE 2 MG/ML IV SOLN
INTRAVENOUS | Status: DC | PRN
  Administered 2024-09-27: 16 mg via INTRAVENOUS

## 2024-09-27 MED ORDER — DOCUSATE SODIUM 50 MG/5ML PO LIQD
100.0000 mg | Freq: Two times a day (BID) | ORAL | Status: DC
  Administered 2024-09-27 – 2024-09-28 (×2): 100 mg
  Filled 2024-09-27 (×2): qty 10

## 2024-09-27 MED ORDER — VASOPRESSIN 20 UNIT/ML IV SOLN
INTRAVENOUS | Status: AC
  Filled 2024-09-27: qty 1

## 2024-09-27 MED ORDER — DEXMEDETOMIDINE HCL IN NACL 400 MCG/100ML IV SOLN
0.0000 ug/kg/h | INTRAVENOUS | Status: DC
  Administered 2024-09-27 – 2024-09-28 (×2): 0.4 ug/kg/h via INTRAVENOUS
  Filled 2024-09-27 (×2): qty 100

## 2024-09-27 MED ORDER — PROPOFOL 500 MG/50ML IV EMUL
INTRAVENOUS | Status: DC | PRN
  Administered 2024-09-27: 60 ug/kg/min via INTRAVENOUS

## 2024-09-27 MED ORDER — ORAL CARE MOUTH RINSE: 15.0000 mL | Freq: Once | OROMUCOSAL | Status: DC

## 2024-09-27 MED ORDER — SODIUM CHLORIDE 0.9% IV SOLUTION: Freq: Once | INTRAVENOUS | Status: AC

## 2024-09-27 MED ORDER — FENTANYL CITRATE (PF) 50 MCG/ML IJ SOSY
50.0000 ug | PREFILLED_SYRINGE | INTRAMUSCULAR | Status: DC | PRN
  Administered 2024-09-28: 50 ug via INTRAVENOUS
  Filled 2024-09-27: qty 1

## 2024-09-27 MED ORDER — CALCIUM GLUCONATE-NACL 1-0.675 GM/50ML-% IV SOLN
1.0000 g | Freq: Once | INTRAVENOUS | Status: AC
  Administered 2024-09-27: 1000 mg via INTRAVENOUS
  Filled 2024-09-27: qty 50

## 2024-09-27 MED ORDER — PROPOFOL 1000 MG/100ML IV EMUL
0.0000 ug/kg/min | INTRAVENOUS | Status: DC
  Administered 2024-09-27: 30 ug/kg/min via INTRAVENOUS
  Filled 2024-09-27: qty 100

## 2024-09-27 MED ORDER — 0.9 % SODIUM CHLORIDE (POUR BTL) OPTIME
TOPICAL | Status: DC | PRN
  Administered 2024-09-27: 2000 mL

## 2024-09-27 MED ORDER — LACTATED RINGERS IV SOLN: INTRAVENOUS | Status: DC

## 2024-09-27 MED ORDER — VASOPRESSIN 20 UNIT/ML IV SOLN
INTRAVENOUS | Status: DC | PRN
  Administered 2024-09-27: 2 [IU] via INTRAVENOUS

## 2024-09-27 MED ORDER — CHLORHEXIDINE GLUCONATE 0.12 % MT SOLN: 15.0000 mL | Freq: Once | OROMUCOSAL | Status: DC

## 2024-09-27 MED ORDER — POLYETHYLENE GLYCOL 3350 17 G PO PACK
17.0000 g | PACK | Freq: Every day | ORAL | Status: DC
  Administered 2024-09-28: 17 g
  Filled 2024-09-27: qty 1

## 2024-09-27 MED ORDER — CEFAZOLIN SODIUM-DEXTROSE 1-4 GM/50ML-% IV SOLN
1.0000 g | Freq: Once | INTRAVENOUS | Status: AC
  Administered 2024-09-27: 2 g via INTRAVENOUS

## 2024-09-27 SURGICAL SUPPLY — 35 items
BAG COUNTER SPONGE SURGICOUNT (BAG) ×1 IMPLANT
CANISTER SUCTION 3000ML PPV (SUCTIONS) ×2 IMPLANT
CHLORAPREP W/TINT 26 (MISCELLANEOUS) ×1 IMPLANT
CLIP APPLIE 11 MED OPEN (CLIP) ×1 IMPLANT
CLIP APPLIE 13 LRG OPEN (CLIP) IMPLANT
COVER SURGICAL LIGHT HANDLE (MISCELLANEOUS) ×1 IMPLANT
DRAPE LAPAROSCOPIC ABDOMINAL (DRAPES) ×1 IMPLANT
DRSG OPSITE POSTOP 4X10 (GAUZE/BANDAGES/DRESSINGS) IMPLANT
ELECT BLADE 6.5 EXT (BLADE) ×1 IMPLANT
ELECTRODE REM PT RTRN 9FT ADLT (ELECTROSURGICAL) ×1 IMPLANT
GAUZE 4X4 16PLY ~~LOC~~+RFID DBL (SPONGE) IMPLANT
GLOVE BIO SURGEON STRL SZ8 (GLOVE) ×1 IMPLANT
GLOVE BIOGEL PI IND STRL 8 (GLOVE) ×1 IMPLANT
GOWN STRL REUS W/ TWL LRG LVL3 (GOWN DISPOSABLE) ×2 IMPLANT
GOWN STRL REUS W/ TWL XL LVL3 (GOWN DISPOSABLE) ×1 IMPLANT
HANDLE SUCTION POOLE (INSTRUMENTS) IMPLANT
HEMOSTAT SNOW SURGICEL 2X4 (HEMOSTASIS) IMPLANT
KIT BASIN OR (CUSTOM PROCEDURE TRAY) ×1 IMPLANT
KIT TURNOVER KIT B (KITS) ×1 IMPLANT
PACK GENERAL/GYN (CUSTOM PROCEDURE TRAY) ×1 IMPLANT
PAD ARMBOARD POSITIONER FOAM (MISCELLANEOUS) ×2 IMPLANT
SOLN 0.9% NACL POUR BTL 1000ML (IV SOLUTION) ×2 IMPLANT
SPECIMEN JAR X LARGE (MISCELLANEOUS) ×1 IMPLANT
SPIKE FLUID TRANSFER (MISCELLANEOUS) IMPLANT
SPONGE T-LAP 18X18 ~~LOC~~+RFID (SPONGE) IMPLANT
STAPLER SKIN PROX 35W (STAPLE) ×1 IMPLANT
SUT PDS AB 1 TP1 96 (SUTURE) ×2 IMPLANT
SUT SILK 0 SH 30 (SUTURE) IMPLANT
SUT SILK 0 TIES 10X30 (SUTURE) ×1 IMPLANT
SUT SILK 2 0 SH (SUTURE) ×2 IMPLANT
SUT SILK 2 0 TIES 10X30 (SUTURE) ×1 IMPLANT
SUT SILK 2 0SH CR/8 30 (SUTURE) ×1 IMPLANT
TOWEL GREEN STERILE (TOWEL DISPOSABLE) ×1 IMPLANT
TOWEL GREEN STERILE FF (TOWEL DISPOSABLE) ×1 IMPLANT
TRAY FOLEY MTR SLVR 14FR STAT (SET/KITS/TRAYS/PACK) IMPLANT

## 2024-09-27 NOTE — Anesthesia Procedure Notes (Signed)
 Procedure Name: Intubation Date/Time: 09/27/2024 5:05 PM  Performed by: Roslynn Waddell LABOR, CRNAPre-anesthesia Checklist: Patient identified, Emergency Drugs available, Suction available and Patient being monitored Patient Re-evaluated:Patient Re-evaluated prior to induction Oxygen Delivery Method: Circle System Utilized Preoxygenation: Pre-oxygenation with 100% oxygen Induction Type: IV induction Laryngoscope Size: Mac and 3 Grade View: Grade I Tube type: Oral Tube size: 7.0 mm Number of attempts: 1 Airway Equipment and Method: Stylet and Oral airway Placement Confirmation: ETT inserted through vocal cords under direct vision, positive ETCO2 and breath sounds checked- equal and bilateral Secured at: 21 cm Tube secured with: Tape Dental Injury: Teeth and Oropharynx as per pre-operative assessment  Comments: RSI. Atraumatic induction/intubation. Dentition and oral mucosa as per preop.

## 2024-09-27 NOTE — Progress Notes (Signed)
 Patient examined in ICU postop. Initial blood pressures soft with MAPs in 60s. BP improved with fluid and propofol  wean. Transitioning to precedex. Labs show ongoing metabolic acidosis however pH is improving. Received 4u PRBCs intra-op, awaiting 2u FFP from blood bank, platelets transfusing. Trop elevated, likely demand ischemia, will obtain EKG and trend.

## 2024-09-27 NOTE — H&P (Incomplete)
 Sarah Bird 02/15/1936  [id number].    Requesting MD: Waymond, MD Chief Complaint/Reason for Consult: ground level fall, splenic rupture  HPI:  Sarah Bird is an 88 y/o F with a PMH A-fib on Eliquis, hypertension, basal cell carcinoma, and recent treatment for UTI who presented to [facility] regional hospital after a ground-level fall.  States her legs gave out while walking and she fell to the floor.  Denies loss of consciousness***.  Found by her son the next morning who called EMS.  She was hemodynamically stable in the ED. Workup at [facility] was significant for a posterior scalp soft tissue injury and splenic rupture with hemoperitoneum and acute hemorrhage in the gastrosplenic ligament.  Otherwise CT head, C-spine, chest, abdomen, pelvis were negative.  Patient was transferred to [facility] trauma center for emergent splenectomy and admission.  Last reported dose of Eliquis was 11/13 in the morning Surgical history significant for abdominal hysterectomy Patient denies tobacco or drug use.  Does report alcohol use, 7 drinks weekly ***.   ROS: Review of Systems  All other systems reviewed and are negative.   Family History  Problem Relation Age of Onset   Colon cancer Mother 56   Colon cancer Brother 54   Breast cancer Maternal Aunt        3 aunts age 35's   Breast cancer Maternal Grandmother        mat great gm    Past Medical History:  Diagnosis Date   Atrial fibrillation (HCC)    Cancer (HCC) 1966   Cervical   Hypertension     Past Surgical History:  Procedure Laterality Date   ABDOMINAL HYSTERECTOMY  1966   CERVICAL CONIZATION W/BX     COLONOSCOPY  09/2007   Dr Dessa   COLONOSCOPY WITH PROPOFOL  N/A 09/29/2015   Procedure: COLONOSCOPY WITH PROPOFOL ;  Surgeon: Reyes LELON Dessa, MD;  Location: [facility] ENDOSCOPY;  Service: Endoscopy;  Laterality: N/A;   EYE SURGERY Bilateral 4 years ago   cataracts   TONSILLECTOMY  1945    Social History:  reports that she  has never smoked. She has never used smokeless tobacco. She reports current alcohol use of about 7.0 standard drinks of alcohol per week. She reports that she does not use drugs.  Allergies:  Allergies  Allergen Reactions   Losartan    Other Other (See Comments)   Risedronate Other (See Comments)   Sulfa Antibiotics Rash and Other (See Comments)    (Not in a hospital admission)    Physical Exam: Blood pressure 96/82, pulse 100, temperature 97.7 F (36.5 C), temperature source Axillary, resp. rate 20, height 5' 6 (1.676 m), weight 79.4 kg, SpO2 97%. General: Elderly white female in no acute distress HEENT: head -normocephalic, atraumatic; Eyes: PERRLA, no conjunctival injection; Ears- no external lesions or tenderness ***posterior scalp Neck- Trachea is midline CV- RRR, normal S1/S2, no M/R/G, *** no lower extremity edema  Pulm- breathing is non-labored. CTABL, no wheezes, rhales, rhonchi. Abd- soft, tender to palpation in the right hemiabdomen without peritonitis ***, no hernias or masses GU- deferred  MSK- UE/LE symmetrical, no cyanosis, clubbing, or edema. Neuro- CN II-XII grossly in tact, no paresthesias. Psych- Alert and Oriented x3 with appropriate affect Skin: warm and dry, no rashes or lesions   Results for orders placed or performed during the hospital encounter of 09/27/24 (from the past 48 hours)  Basic metabolic panel     Status: Abnormal   Collection Time: 09/27/24 11:29 AM  Result Value Ref Range   Sodium 135 135 - 145 mmol/L   Potassium 3.9 3.5 - 5.1 mmol/L   Chloride 99 98 - 111 mmol/L   CO2 18 (L) 22 - 32 mmol/L   Glucose, Bld 101 (H) 70 - 99 mg/dL    Comment: Glucose reference range applies only to samples taken after fasting for at least 8 hours.   BUN 40 (H) 8 - 23 mg/dL   Creatinine, Ser 7.71 (H) 0.44 - 1.00 mg/dL   Calcium 8.0 (L) 8.9 - 10.3 mg/dL   GFR, Estimated 20 (L) >60 mL/min    Comment: (NOTE) Calculated using the CKD-EPI Creatinine Equation  (2021)    Anion gap 18 (H) 5 - 15    Comment: Performed at [facility], [address] [place], [place] [address]  Troponin T, High Sensitivity     Status: Abnormal   Collection Time: 09/27/24 11:29 AM  Result Value Ref Range   Troponin T High Sensitivity 269 (HH) 0 - 19 ng/L    Comment: Critical Value, Read Back and verified with Zachary Solid 09/27/24 1222 KLW (NOTE) Biotin concentrations > 1000 ng/mL falsely decrease TnT results.  Serial cardiac troponin measurements are suggested.  Refer to the Links section for chest pain algorithms and additional  guidance. Performed at [facility], [address] [place], [place] [address]   CBC with Differential     Status: Abnormal   Collection Time: 09/27/24 11:29 AM  Result Value Ref Range   WBC 9.1 4.0 - 10.5 K/uL   RBC 3.25 (L) 3.87 - 5.11 MIL/uL   Hemoglobin 9.7 (L) 12.0 - 15.0 g/dL   HCT 70.7 (L) 63.9 - 53.9 %   MCV 89.8 80.0 - 100.0 fL   MCH 29.8 26.0 - 34.0 pg   MCHC 33.2 30.0 - 36.0 g/dL   RDW 85.8 88.4 - 84.4 %   Platelets 159 150 - 400 K/uL   nRBC 0.0 0.0 - 0.2 %   Neutrophils Relative % 84 %   Neutro Abs 7.6 1.7 - 7.7 K/uL   Lymphocytes Relative 8 %   Lymphs Abs 0.7 0.7 - 4.0 K/uL   Monocytes Relative 8 %   Monocytes Absolute 0.7 0.1 - 1.0 K/uL   Eosinophils Relative 0 %   Eosinophils Absolute 0.0 0.0 - 0.5 K/uL   Basophils Relative 0 %   Basophils Absolute 0.0 0.0 - 0.1 K/uL   Immature Granulocytes 0 %   Abs Immature Granulocytes 0.04 0.00 - 0.07 K/uL    Comment: Performed at [facility], [address] [place], [place] [address]  CK     Status: Abnormal   Collection Time: 09/27/24 11:29 AM  Result Value Ref Range   Total CK 3,931 (H) 38 - 234 U/L    Comment: Performed at [facility], [address] [place], [place] [address]   CT CHEST ABDOMEN PELVIS WO CONTRAST Result Date: 09/27/2024 EXAM: CT CHEST, ABDOMEN AND PELVIS WITHOUT CONTRAST 09/27/2024 01:11:49 PM  TECHNIQUE: CT of the chest, abdomen and pelvis was performed without the administration of intravenous contrast. Multiplanar reformatted images are provided for review. Automated exposure control, iterative reconstruction, and/or weight based adjustment of the mA/kV was utilized to reduce the radiation dose to as low as reasonably achievable. COMPARISON: None available. CLINICAL HISTORY: Polytrauma, blunt. FINDINGS: CHEST: MEDIASTINUM AND LYMPH NODES: Heart and pericardium are unremarkable. The central airways are clear. No mediastinal, hilar or axillary lymphadenopathy. LUNGS AND PLEURA: 3 discrete  pulmonary nodules are present. A nodule in the left lower lobe on imaging 96 of series 5 measures 5 x 4 mm. An ill defined nodule is present within the inferior left upper lobe on image 102 measuring 10 x 7 mm. A new high density nodule in the right lower lobe superior segment measures 6 x 3 mm on image 84 of series 5. Mild dependent atelectasis is present bilaterally. No pleural effusion or pneumothorax. ABDOMEN AND PELVIS: LIVER: The liver is unremarkable. GALLBLADDER AND BILE DUCTS: Gallbladder is unremarkable. No biliary ductal dilatation. SPLEEN: Splenic rupture is present. High density material is present in the expected hilum of the spleen, consistent with hemorrhage. Diffuse stranding present in the adjacent fat. Acute hemorrhage is present in the gastrosplenic ligament. PANCREAS: No acute abnormality. ADRENAL GLANDS: No acute abnormality. KIDNEYS, URETERS AND BLADDER: No stones in the kidneys or ureters. No hydronephrosis. No perinephric or periureteral stranding. Urinary bladder is unremarkable. GI AND BOWEL: Stomach demonstrates no acute abnormality. Inflammatory changes are present in the splenic flexure of the colon. There is no bowel obstruction. REPRODUCTIVE ORGANS: No acute abnormality. PERITONEUM AND RETROPERITONEUM: Hemoperitoneum is noted. High density blood extends along the left paracolic gutter and  into the anatomic pelvis. No free air. VASCULATURE: Aorta is normal in caliber. Atherosclerotic calcifications are present in the aorta and branch vessels without aneurysm. ABDOMINAL AND PELVIS LYMPH NODES: No lymphadenopathy. BONES AND SOFT TISSUES: Multilevel degenerative changes in the lower lumbar spine are greatest at L3-L4 and L5-S1. No acute osseous abnormality. No focal soft tissue abnormality. IMPRESSION: 1. Splenic rupture with hemoperitoneum and acute hemorrhage in the gastrosplenic ligament. 2. High-density blood tracking along the left paracolic gutter into the pelvis. 3. Three discrete pulmonary nodules, including a 10 x 7 mm ill-defined left upper lobe nodule and a new 6 x 3 mm right lower lobe nodule; recommend management per Fleischner Society Guidelines based on risk, with consideration of non-contrast chest CT at 3 months, PET/CT, or tissue sampling for the 8.120 mm nodule. PET CT critical value findings were called to Dr. Waymond at 1:57 pm Electronically signed by: Lonni Necessary MD 09/27/2024 01:59 PM EST RP Workstation: [id number]   CT Cervical Spine Wo Contrast Result Date: 09/27/2024 EXAM: CT CERVICAL SPINE WITHOUT CONTRAST 09/27/2024 01:11:49 PM TECHNIQUE: CT of the cervical spine was performed without the administration of intravenous contrast. Multiplanar reformatted images are provided for review. Automated exposure control, iterative reconstruction, and/or weight based adjustment of the mA/kV was utilized to reduce the radiation dose to as low as reasonably achievable. COMPARISON: CT head 09/27/2024, reported separately. CLINICAL HISTORY: Patient is >= 34 years old. Neck trauma. FINDINGS: CERVICAL SPINE: BONES AND ALIGNMENT: Maintained cervical lordosis. Mild degenerative appearing anterolisthesis of C4 on C5. No acute fracture or traumatic malalignment. DEGENERATIVE CHANGES: Generally mild for age cervical spine degeneration. Mild degenerative appearing anterolisthesis of C4 on  C5. No CT evidence of cervical spinal stenosis. SOFT TISSUES: No prevertebral soft tissue swelling. Negative for age visible non-contrast neck soft tissues; Asymmetric left neck level 3 lymph nodes but they remain within normal limits by size criteria. IMPRESSION: 1. No acute traumatic injury identified in the cervical spine. 2. Mild for age cervical spine degeneration. Electronically signed by: Helayne Hurst MD 09/27/2024 01:24 PM EST RP Workstation: [id number]   CT Head Wo Contrast Result Date: 09/27/2024 EXAM: CT HEAD WITHOUT CONTRAST 09/27/2024 01:11:49 PM TECHNIQUE: CT of the head was performed without the administration of intravenous contrast. Automated exposure control, iterative reconstruction, and/or weight based  adjustment of the mA/kV was utilized to reduce the radiation dose to as low as reasonably achievable. COMPARISON: None available. CLINICAL HISTORY: 88 year old female with minor head trauma and recent Mohs surgery. FINDINGS: BRAIN AND VENTRICLES: No acute hemorrhage. No evidence of acute infarct. No hydrocephalus. No extra-axial collection. No mass effect or midline shift. Normal brain volume for age. Moderate for age heterogeneous hypodensity in the bilateral deep white matter capsules and the bilateral deep gray nuclei compatible with small vessel disease. No cortical encephalomalacia identified. No suspicious intracranial vascular hyperdensity. Mild for age calcified atherosclerosis at the skull base. ORBITS: Postoperative changes to the globes. No acute orbit injury identified. SINUSES: Visible paranasal sinuses, middle ears and mastoids are well aerated. SOFT TISSUES AND SKULL: Broad based posterior vertex deep scalp soft tissue swelling might be related to injury, uncertain (sagittal image 32). Nearby superficial scalp soft tissue scarring. Underlying calvarium intact. No skull fracture. IMPRESSION: 1. Posterior vertex deep scalp soft tissue could be injury (sagittal image 32). Nearby  superficial scalp scarring. Underlying calvarium intact. Recommend correlation with physical exam. 2. No acute intracranial abnormality. Moderate for age sequelae of cerebral small vessel disease. Electronically signed by: Helayne Hurst MD 09/27/2024 01:20 PM EST RP Workstation: [id number]   DG Chest 2 View Result Date: 09/27/2024 CLINICAL DATA:  Pain. EXAM: CHEST - 2 VIEW COMPARISON:  None Available. FINDINGS: The heart size and mediastinal contours are within normal limits. No focal consolidation, pleural effusion, or pneumothorax. Diffuse osseous demineralization. No acute osseous abnormality. IMPRESSION: No acute cardiopulmonary findings. Electronically Signed   By: Harrietta Sherry M.D.   On: 09/27/2024 12:37   DG Foot Complete Right Result Date: 09/27/2024 CLINICAL DATA:  Pain. EXAM: RIGHT FOOT COMPLETE - 3+ VIEW COMPARISON:  None Available. FINDINGS: Diffuse osseous demineralization. No acute fracture or dislocation. Minimal first MTP joint osteoarthritis. Soft tissues are unremarkable. IMPRESSION: 1. No acute osseous abnormality. 2. Diffuse osseous demineralization. Electronically Signed   By: Harrietta Sherry M.D.   On: 09/27/2024 12:36    Assessment/Plan 88 y/o F s/p GLF on Eliquis  Ruptured spleen with active hemorrhage - to OR for emergent exploratory laparotomy, splenectomy  ABL anemia - due to above;  K Centra ordered by outside hospital at 1500 but, unfortunately, the patient was sent via [facility] without it. Hgb is 9.7, on CBC from 02/2023 hgb was 15.1; type and screen is pending.  Posterior scalp hematoma  Rhabdomyolysis - UA pending; give IVF  AKI - BUN 40, Cr 2.28; on 08/19/24 BUN 14/Cr 0.9 Elevated troponin - 269, repeat troponin pending ; EKG with afib without ST changes;  HTN Afib - hold DOAC;  FEN - NPO. IVF VTE - SCD's, hold chemical VTE in the setting of active bleeding ID - rocephin on call to OR Admit - trauma service, plan admission to ICU post-op    I reviewed  {Reviewed data:26882::last 24 h vitals and pain scores,last 48 h intake and output,last 24 h labs and trends,last 24 h imaging results}. *** Almarie GORMAN Pringle, PA-C [facility] Surgery 09/27/2024, 3:01 PM Please see Amion for pager number during day hours 7:00am-4:30pm or 7:00am -11:30am on weekends

## 2024-09-27 NOTE — Anesthesia Postprocedure Evaluation (Signed)
 Anesthesia Post Note  Patient: Sarah Bird  Procedure(s) Performed: SPLENECTOMY (Abdomen)     Patient location during evaluation: SICU Anesthesia Type: General Level of consciousness: sedated Pain management: pain level controlled Vital Signs Assessment: post-procedure vital signs reviewed and stable Respiratory status: patient remains intubated per anesthesia plan Cardiovascular status: stable Postop Assessment: no apparent nausea or vomiting Anesthetic complications: no   No notable events documented.  Last Vitals:  Vitals:   09/27/24 1626  BP: 102/85  Pulse: 88  Resp: 16  Temp: (!) 36.1 C  SpO2: 93%    Last Pain:  Vitals:   09/27/24 1626  TempSrc: Axillary  PainSc: 8                  Sarah Bird

## 2024-09-27 NOTE — Anesthesia Preprocedure Evaluation (Addendum)
 Anesthesia Evaluation  Patient identified by MRN, date of birth, ID band Patient awake    Reviewed: Allergy & Precautions, H&P , NPO status , Patient's Chart, lab work & pertinent test results  Airway Mallampati: II   Neck ROM: full    Dental   Pulmonary neg pulmonary ROS   breath sounds clear to auscultation       Cardiovascular hypertension, + dysrhythmias  Rhythm:regular Rate:Normal     Neuro/Psych negative neurological ROS     GI/Hepatic negative GI ROS,,,(+)     substance abuse    Endo/Other  negative endocrine ROS    Renal/GU Renal disease     Musculoskeletal negative musculoskeletal ROS (+)    Abdominal   Peds  Hematology  (+) Blood dyscrasia (Eliquis), anemia   Anesthesia Other Findings Ruptured Spleen  Reproductive/Obstetrics                              Anesthesia Physical Anesthesia Plan  ASA: 3 and emergent  Anesthesia Plan: General   Post-op Pain Management:    Induction: Intravenous  PONV Risk Score and Plan: 3 and Ondansetron, Dexamethasone and Treatment may vary due to age or medical condition  Airway Management Planned: Oral ETT  Additional Equipment: Arterial line  Intra-op Plan:   Post-operative Plan: Possible Post-op intubation/ventilation  Informed Consent: I have reviewed the patients History and Physical, chart, labs and discussed the procedure including the risks, benefits and alternatives for the proposed anesthesia with the patient or authorized representative who has indicated his/her understanding and acceptance.     Dental advisory given  Plan Discussed with: CRNA, Anesthesiologist and Surgeon  Anesthesia Plan Comments:         Anesthesia Quick Evaluation

## 2024-09-27 NOTE — Op Note (Signed)
  09/27/2024  6:30 PM  PATIENT:  Sarah Bird  88 y.o. female  PRE-OPERATIVE DIAGNOSIS:  Ruptured Spleen  POST-OPERATIVE DIAGNOSIS:  Ruptured Spleen  PROCEDURE:  Procedure(s): SPLENECTOMY  SURGEON:  Dann Hummer, MD  ASSISTANTS: Adina Lima, MD  The assistant was required throughout due to difficult retraction and complicated procedure.  ANESTHESIA:   general  EBL:  Total I/O In: 2890 [I.V.:1200; Blood:1260; IV Piggyback:430] Out: 1600 [Blood:1600]  BLOOD ADMINISTERED:3u CC PRBC, 2u FFP ordered  DRAINS: none   SPECIMEN:  Excision  DISPOSITION OF SPECIMEN:  PATHOLOGY  COUNTS:  YES  DICTATION: .Dragon Dictation Procedure detail: Informed consent was obtained.  She received intravenous antibiotics.  She was brought to the operating room and general anesthesia was administered by the anesthesia staff.  She received Kcentra to reverse her anticoagulation.  Foley catheter was placed by nursing.  Abdomen was prepped and draped in sterile fashion.  We did a timeout procedure.  Upper midline incision was made.  Subcutaneous tissues were dissected out revealing the anterior fascia.  This was divided sharply along the midline.  Peritoneal cavity was entered revealing moderate hemoperitoneum.  Most of the blood was in the upper quadrants.  It was removed with packs and then the spleen was mobilized from lateral peritoneal attachments and delivered up into the wound.  We took down the splenic vessels and short gastrics with Thalia but her tissue was extremely friable like wet tissue paper and Tearing.  The spleen was removed.  We then suture-ligated the hilar vessels and short gastrics with multiple 0 silk sutures.  There was ongoing bleeding that was difficult to control due to her friable tissue.  We placed additional sutures in a couple of large clips.  She was undergoing ongoing resuscitation by anesthesia.  We evacuated all the clots from the left upper quadrant.  We placed additional  sutures along the splenic bed and were able to get good hemostasis finally.  A place of Surgicel was left in the area.  The small bowel was run to the ligament of Treitz down to the terminal ileum and looked okay.  The right colon, transverse colon, left colon, sigmoid colon and rectosigmoid all looked okay.  The stomach looked okay.  The liver was smooth.  No other intra-abdominal injuries were noted.  We rechecked the left upper quadrant and the splenic hilum bed was dry.  The abdomen was irrigated.  Irrigation returned clear.  Fascia was closed with running #1 looped PDS after bringing the omentum down over the bowel and appropriate anatomic position.  We then got an x-ray because initially, the sponge count was off by 5.  We then noted that it was in an open pack in the room that had been scanned in.  We got an x-ray anyway which showed no remaining sponges and the sponge count was correct.  Subcutaneous tissues were closed with staples and a sterile dressing was applied.  She tolerated the procedure well but remained in critical condition and will be taken directly to the ICU on the ventilator.  No complications. PATIENT DISPOSITION:  ICU - intubated and critically ill.   Delay start of Pharmacological VTE agent (>24hrs) due to surgical blood loss or risk of bleeding:  yes  Dann Hummer, MD, MPH, FACS Pager: [phone]  11/14/20256:30 PM

## 2024-09-27 NOTE — ED Notes (Signed)
 Pt transferred to [facility] for transfer to [facility].

## 2024-09-27 NOTE — Transfer of Care (Signed)
 Immediate Anesthesia Transfer of Care Note  Patient: Sarah Bird  Procedure(s) Performed: SPLENECTOMY (Abdomen)  Patient Location: ICU  Anesthesia Type:General  Level of Consciousness: Patient remains intubated per anesthesia plan  Airway & Oxygen Therapy: Patient remains intubated per anesthesia plan  Post-op Assessment: Report given to RN and Post -op Vital signs reviewed and stable  Post vital signs: Reviewed and stable  Last Vitals:  Vitals Value Taken Time  BP 118/91 09/27/24 19:02  Temp    Pulse 108 09/27/24 1902  Resp 20 09/27/24 19:09  SpO2 97 09/27/24 1909  Vitals shown include unfiled device data.  Last Pain:  Vitals:   09/27/24 1626  TempSrc: Axillary  PainSc: 8       Patients Stated Pain Goal: 2 (09/27/24 1626)  Complications: No notable events documented.

## 2024-09-27 NOTE — ED Notes (Signed)
 [facility]  called per  Dr  Waymond MD

## 2024-09-27 NOTE — H&P (Signed)
 Expand All Collapse All         AMAMDA CURBOW Dec 24, 1935  [id number].     Requesting MD: Waymond, MD Chief Complaint/Reason for Consult: ground level fall, splenic rupture   HPI:  Sarah Bird is an 88 y/o F with a PMH A-fib on Eliquis, hypertension, basal cell carcinoma, and recent treatment for UTI who presented to [facility] hospital after a ground-level fall.  States her legs gave out while walking and she fell to the floor.  Denies loss of consciousness.  Found by her son the next morning who called EMS.  She was hemodynamically stable in the ED. Workup at [facility] was significant for a posterior scalp soft tissue injury and splenic rupture with hemoperitoneum and acute hemorrhage in the gastrosplenic ligament.  Otherwise CT head, C-spine, chest, abdomen, pelvis were negative.  Patient was transferred to [facility] trauma center for emergent splenectomy and admission.   Last reported dose of Eliquis was 11/13 in the morning Surgical history significant for abdominal hysterectomy Patient denies tobacco or drug use.  Does report alcohol use, 7 drinks weekly .     ROS: Review of Systems  All other systems reviewed and are negative.         Family History  Problem Relation Age of Onset   Colon cancer Mother 54   Colon cancer Brother 54   Breast cancer Maternal Aunt          3 aunts age 26's   Breast cancer Maternal Grandmother          mat great gm              Past Medical History:  Diagnosis Date   Atrial fibrillation (HCC)     Cancer (HCC) 1966    Cervical   Hypertension                 Past Surgical History:  Procedure Laterality Date   ABDOMINAL HYSTERECTOMY   1966   CERVICAL CONIZATION W/BX       COLONOSCOPY   09/2007    Dr Dessa   COLONOSCOPY WITH PROPOFOL  N/A 09/29/2015    Procedure: COLONOSCOPY WITH PROPOFOL ;  Surgeon: Reyes LELON Dessa, MD;  Location: [facility] ENDOSCOPY;  Service: Endoscopy;  Laterality: N/A;   EYE SURGERY Bilateral 4 years ago     cataracts   TONSILLECTOMY   1945          Social History:  reports that she has never smoked. She has never used smokeless tobacco. She reports current alcohol use of about 7.0 standard drinks of alcohol per week. She reports that she does not use drugs.   Allergies:  Allergies      Allergies  Allergen Reactions   Losartan     Other Other (See Comments)   Risedronate Other (See Comments)   Sulfa Antibiotics Rash and Other (See Comments)         (Not in a hospital admission)           Physical Exam: Blood pressure 96/82, pulse 100, temperature 97.7 F (36.5 C), temperature source Axillary, resp. rate 20, height 5' 6 (1.676 m), weight 79.4 kg, SpO2 97%. General: Elderly white female in no acute distress HEENT: head -normocephalic, atraumatic; Eyes: PERRLA, no conjunctival injection; Ears- no external lesions or tenderness posterior scalp Neck- Trachea is midline CV- RRR, normal S1/S2, no M/R/G,  mild  lower extremity edema  Pulm- breathing is non-labored. CTABL, no wheezes, rhales, rhonchi. Abd- soft, tender to  palpation RUQ and LUQ with voluntary guarding , no hernias or masses GU- deferred  MSK- UE/LE symmetrical, no cyanosis, clubbing, or edema. Neuro- CN II-XII grossly in tact, no paresthesias. Psych- Alert and Oriented x3 with appropriate affect Skin: warm and dry, no rashes or lesions     Lab Results Last 48 Hours        Results for orders placed or performed during the hospital encounter of 09/27/24 (from the past 48 hours)  Basic metabolic panel     Status: Abnormal    Collection Time: 09/27/24 11:29 AM  Result Value Ref Range    Sodium 135 135 - 145 mmol/L    Potassium 3.9 3.5 - 5.1 mmol/L    Chloride 99 98 - 111 mmol/L    CO2 18 (L) 22 - 32 mmol/L    Glucose, Bld 101 (H) 70 - 99 mg/dL      Comment: Glucose reference range applies only to samples taken after fasting for at least 8 hours.    BUN 40 (H) 8 - 23 mg/dL    Creatinine, Ser 7.71 (H) 0.44 -  1.00 mg/dL    Calcium 8.0 (L) 8.9 - 10.3 mg/dL    GFR, Estimated 20 (L) >60 mL/min      Comment: (NOTE) Calculated using the CKD-EPI Creatinine Equation (2021)      Anion gap 18 (H) 5 - 15      Comment: Performed at [facility], [address] [place], [place] [address]  Troponin T, High Sensitivity     Status: Abnormal    Collection Time: 09/27/24 11:29 AM  Result Value Ref Range    Troponin T High Sensitivity 269 (HH) 0 - 19 ng/L      Comment: Critical Value, Read Back and verified with Zachary Solid 09/27/24 1222 KLW (NOTE) Biotin concentrations > 1000 ng/mL falsely decrease TnT results.  Serial cardiac troponin measurements are suggested.   Refer to the Links section for chest pain algorithms and additional  guidance. Performed at [facility], [address] [place], [place] [address]    CBC with Differential     Status: Abnormal    Collection Time: 09/27/24 11:29 AM  Result Value Ref Range    WBC 9.1 4.0 - 10.5 K/uL    RBC 3.25 (L) 3.87 - 5.11 MIL/uL    Hemoglobin 9.7 (L) 12.0 - 15.0 g/dL    HCT 70.7 (L) 63.9 - 46.0 %    MCV 89.8 80.0 - 100.0 fL    MCH 29.8 26.0 - 34.0 pg    MCHC 33.2 30.0 - 36.0 g/dL    RDW 85.8 88.4 - 84.4 %    Platelets 159 150 - 400 K/uL    nRBC 0.0 0.0 - 0.2 %    Neutrophils Relative % 84 %    Neutro Abs 7.6 1.7 - 7.7 K/uL    Lymphocytes Relative 8 %    Lymphs Abs 0.7 0.7 - 4.0 K/uL    Monocytes Relative 8 %    Monocytes Absolute 0.7 0.1 - 1.0 K/uL    Eosinophils Relative 0 %    Eosinophils Absolute 0.0 0.0 - 0.5 K/uL    Basophils Relative 0 %    Basophils Absolute 0.0 0.0 - 0.1 K/uL    Immature Granulocytes 0 %    Abs Immature Granulocytes 0.04 0.00 - 0.07 K/uL      Comment: Performed at [facility], [address], [place], [place] [address]  CK  Status: Abnormal    Collection Time: 09/27/24 11:29 AM  Result Value Ref Range    Total CK 3,931 (H) 38 - 234 U/L      Comment: Performed at [facility], [address] [place], [place] [address]       Imaging Results (Last 48 hours)  CT CHEST ABDOMEN PELVIS WO CONTRAST Result Date: 09/27/2024 EXAM: CT CHEST, ABDOMEN AND PELVIS WITHOUT CONTRAST 09/27/2024 01:11:49 PM TECHNIQUE: CT of the chest, abdomen and pelvis was performed without the administration of intravenous contrast. Multiplanar reformatted images are provided for review. Automated exposure control, iterative reconstruction, and/or weight based adjustment of the mA/kV was utilized to reduce the radiation dose to as low as reasonably achievable. COMPARISON: None available. CLINICAL HISTORY: Polytrauma, blunt. FINDINGS: CHEST: MEDIASTINUM AND LYMPH NODES: Heart and pericardium are unremarkable. The central airways are clear. No mediastinal, hilar or axillary lymphadenopathy. LUNGS AND PLEURA: 3 discrete pulmonary nodules are present. A nodule in the left lower lobe on imaging 96 of series 5 measures 5 x 4 mm. An ill defined nodule is present within the inferior left upper lobe on image 102 measuring 10 x 7 mm. A new high density nodule in the right lower lobe superior segment measures 6 x 3 mm on image 84 of series 5. Mild dependent atelectasis is present bilaterally. No pleural effusion or pneumothorax. ABDOMEN AND PELVIS: LIVER: The liver is unremarkable. GALLBLADDER AND BILE DUCTS: Gallbladder is unremarkable. No biliary ductal dilatation. SPLEEN: Splenic rupture is present. High density material is present in the expected hilum of the spleen, consistent with hemorrhage. Diffuse stranding present in the adjacent fat. Acute hemorrhage is present in the gastrosplenic ligament. PANCREAS: No acute abnormality. ADRENAL GLANDS: No acute abnormality. KIDNEYS, URETERS AND BLADDER: No stones in the kidneys or ureters. No hydronephrosis. No perinephric or periureteral stranding. Urinary bladder is unremarkable. GI AND BOWEL: Stomach demonstrates no acute abnormality. Inflammatory changes are  present in the splenic flexure of the colon. There is no bowel obstruction. REPRODUCTIVE ORGANS: No acute abnormality. PERITONEUM AND RETROPERITONEUM: Hemoperitoneum is noted. High density blood extends along the left paracolic gutter and into the anatomic pelvis. No free air. VASCULATURE: Aorta is normal in caliber. Atherosclerotic calcifications are present in the aorta and branch vessels without aneurysm. ABDOMINAL AND PELVIS LYMPH NODES: No lymphadenopathy. BONES AND SOFT TISSUES: Multilevel degenerative changes in the lower lumbar spine are greatest at L3-L4 and L5-S1. No acute osseous abnormality. No focal soft tissue abnormality. IMPRESSION: 1. Splenic rupture with hemoperitoneum and acute hemorrhage in the gastrosplenic ligament. 2. High-density blood tracking along the left paracolic gutter into the pelvis. 3. Three discrete pulmonary nodules, including a 10 x 7 mm ill-defined left upper lobe nodule and a new 6 x 3 mm right lower lobe nodule; recommend management per Fleischner Society Guidelines based on risk, with consideration of non-contrast chest CT at 3 months, PET/CT, or tissue sampling for the 8.120 mm nodule. PET CT critical value findings were called to Dr. Waymond at 1:57 pm Electronically signed by: Lonni Necessary MD 09/27/2024 01:59 PM EST RP Workstation: [id number]    CT Cervical Spine Wo Contrast Result Date: 09/27/2024 EXAM: CT CERVICAL SPINE WITHOUT CONTRAST 09/27/2024 01:11:49 PM TECHNIQUE: CT of the cervical spine was performed without the administration of intravenous contrast. Multiplanar reformatted images are provided for review. Automated exposure control, iterative reconstruction, and/or weight based adjustment of the mA/kV was utilized to reduce the radiation dose to as low as reasonably achievable. COMPARISON: CT head 09/27/2024,  reported separately. CLINICAL HISTORY: Patient is >= 49 years old. Neck trauma. FINDINGS: CERVICAL SPINE: BONES AND ALIGNMENT: Maintained cervical  lordosis. Mild degenerative appearing anterolisthesis of C4 on C5. No acute fracture or traumatic malalignment. DEGENERATIVE CHANGES: Generally mild for age cervical spine degeneration. Mild degenerative appearing anterolisthesis of C4 on C5. No CT evidence of cervical spinal stenosis. SOFT TISSUES: No prevertebral soft tissue swelling. Negative for age visible non-contrast neck soft tissues; Asymmetric left neck level 3 lymph nodes but they remain within normal limits by size criteria. IMPRESSION: 1. No acute traumatic injury identified in the cervical spine. 2. Mild for age cervical spine degeneration. Electronically signed by: Helayne Hurst MD 09/27/2024 01:24 PM EST RP Workstation: [id number]    CT Head Wo Contrast Result Date: 09/27/2024 EXAM: CT HEAD WITHOUT CONTRAST 09/27/2024 01:11:49 PM TECHNIQUE: CT of the head was performed without the administration of intravenous contrast. Automated exposure control, iterative reconstruction, and/or weight based adjustment of the mA/kV was utilized to reduce the radiation dose to as low as reasonably achievable. COMPARISON: None available. CLINICAL HISTORY: 88 year old female with minor head trauma and recent Mohs surgery. FINDINGS: BRAIN AND VENTRICLES: No acute hemorrhage. No evidence of acute infarct. No hydrocephalus. No extra-axial collection. No mass effect or midline shift. Normal brain volume for age. Moderate for age heterogeneous hypodensity in the bilateral deep white matter capsules and the bilateral deep gray nuclei compatible with small vessel disease. No cortical encephalomalacia identified. No suspicious intracranial vascular hyperdensity. Mild for age calcified atherosclerosis at the skull base. ORBITS: Postoperative changes to the globes. No acute orbit injury identified. SINUSES: Visible paranasal sinuses, middle ears and mastoids are well aerated. SOFT TISSUES AND SKULL: Broad based posterior vertex deep scalp soft tissue swelling might be related  to injury, uncertain (sagittal image 32). Nearby superficial scalp soft tissue scarring. Underlying calvarium intact. No skull fracture. IMPRESSION: 1. Posterior vertex deep scalp soft tissue could be injury (sagittal image 32). Nearby superficial scalp scarring. Underlying calvarium intact. Recommend correlation with physical exam. 2. No acute intracranial abnormality. Moderate for age sequelae of cerebral small vessel disease. Electronically signed by: Helayne Hurst MD 09/27/2024 01:20 PM EST RP Workstation: [id number]    DG Chest 2 View Result Date: 09/27/2024 CLINICAL DATA:  Pain. EXAM: CHEST - 2 VIEW COMPARISON:  None Available. FINDINGS: The heart size and mediastinal contours are within normal limits. No focal consolidation, pleural effusion, or pneumothorax. Diffuse osseous demineralization. No acute osseous abnormality. IMPRESSION: No acute cardiopulmonary findings. Electronically Signed   By: Harrietta Sherry M.D.   On: 09/27/2024 12:37    DG Foot Complete Right Result Date: 09/27/2024 CLINICAL DATA:  Pain. EXAM: RIGHT FOOT COMPLETE - 3+ VIEW COMPARISON:  None Available. FINDINGS: Diffuse osseous demineralization. No acute fracture or dislocation. Minimal first MTP joint osteoarthritis. Soft tissues are unremarkable. IMPRESSION: 1. No acute osseous abnormality. 2. Diffuse osseous demineralization. Electronically Signed   By: Harrietta Sherry M.D.   On: 09/27/2024 12:36       Assessment/Plan 88 y/o F s/p GLF on Eliquis  Ruptured spleen with active hemorrhage - to OR for emergent exploratory laparotomy, splenectomy  ABL anemia - due to above;  K Centra ordered by outside hospital at 1500 but, unfortunately, the patient was sent via [facility] without it. Hgb is 9.7, on CBC from 02/2023 hgb was 15.1; type and screen is pending.  Posterior scalp hematoma  Rhabdomyolysis - UA pending; give IVF and bicarb post-op AKI - BUN 40, Cr 2.28; on 08/19/24 BUN 14/Cr 0.9  Elevated troponin - 269, repeat  troponin pending ; EKG with afib without ST changes;   HTN Afib - hold DOAC;   FEN - NPO. IVF VTE - SCD's, hold chemical VTE in the setting of active bleeding ID - rocephin on call to OR Admit - trauma service, plan admission to ICU post-op    For splenectomy. Procedure, risks, and benefits discussed. She agrees. She agrees to blood transfusion if needed. Admit to ICU post-op.   I reviewed nursing notes, ED provider notes, last 24 h vitals and pain scores, last 24 h labs and trends, and last 24 h imaging results

## 2024-09-27 NOTE — ED Provider Notes (Addendum)
 Sarah Bird Provider Note    Event Date/Time   First MD Initiated Contact with Patient 09/27/24 1111     (approximate)   History   Fall (Fell last night at 2030, spent night on the floor.  C/O pain back of head and L lateral chest, No LOC, on Eliquis)   HPI  Sarah Bird is a 88 y.o. female with history of hypertension, basal cell carcinoma status post resection, on Eliquis presenting with fall.  Patient states that she has been feeling weak for last 2 days, is on antibiotics for UTI.  She denies any preceding symptoms, states that when she was trying to get up to walk last night, her legs gave out and she fell on the floor, was unable to get up.  Was found by son this morning who called EMS to take her here.  Denies any new cough, no vomiting or diarrhea, no fevers at home.  States that since the fall has been having left lateral rib pain, left abdominal pain, left hip pain, right foot pain.  Patient states that she lives at home alone, typically is able to do all her ADLs until 2 days ago when she started feel weaker.  On independent history from EMS, she fell last night at 830 and spent the night on the floor since then.  No LOC.  On independent chart review, patient was seen by primary care in October, has history of chronic A-fib on Eliquis, history of hypertension and CKD.     Physical Exam   Triage Vital Signs: ED Triage Vitals  Encounter Vitals Group     BP      Girls Systolic BP Percentile      Girls Diastolic BP Percentile      Boys Systolic BP Percentile      Boys Diastolic BP Percentile      Pulse      Resp      Temp      Temp src      SpO2      Weight      Height      Head Circumference      Peak Flow      Pain Score      Pain Loc      Pain Education      Exclude from Growth Chart     Most recent vital signs: Vitals:   09/27/24 1116  BP: 96/82  Pulse: 100  Resp: 20  Temp: 97.7 F (36.5 C)  SpO2: 97%      General: Awake, no distress.  CV:  Good peripheral perfusion.  Resp:  Normal effort.  Left lateral thoracic cage tenderness Abd:  No distention.  Soft, tender to the left upper quadrant and left lower quadrant, no guarding Other:  Able to fully range all 4 extremities, she does have left lateral hip tenderness on exam, is tender to the right dorsal foot.  No other tenderness to the rest of her extremities, no midline spinal tenderness.  Able to move all 4 extremities without focal weakness.   ED Results / Procedures / Treatments   Labs (all labs ordered are listed, but only abnormal results are displayed) Labs Reviewed  BASIC METABOLIC PANEL WITH GFR - Abnormal; Notable for the following components:      Result Value   CO2 18 (*)    Glucose, Bld 101 (*)    BUN 40 (*)    Creatinine, Ser 2.28 (*)  Calcium 8.0 (*)    GFR, Estimated 20 (*)    Anion gap 18 (*)    All other components within normal limits  CBC WITH DIFFERENTIAL/PLATELET - Abnormal; Notable for the following components:   RBC 3.25 (*)    Hemoglobin 9.7 (*)    HCT 29.2 (*)    All other components within normal limits  CK - Abnormal; Notable for the following components:   Total CK 3,931 (*)    All other components within normal limits  TROPONIN T, HIGH SENSITIVITY - Abnormal; Notable for the following components:   Troponin T High Sensitivity 269 (*)    All other components within normal limits  URINALYSIS, W/ REFLEX TO CULTURE (INFECTION SUSPECTED)  PROTIME-INR  HEPATIC FUNCTION PANEL  TYPE AND SCREEN  TROPONIN T, HIGH SENSITIVITY     EKG  EKG shows, atrial fibrillation, rate 100, normal QS, normal QTc, no obvious ischemic ST elevation, T wave flattening to inferior lateral leads, no prior to compare   RADIOLOGY On my independent interpretation, CT head without obvious intracranial hemorrhage   PROCEDURES:  Critical Care performed: Yes, see critical care procedure note(s)  .Critical  Care  Performed by: Waymond Lorelle Cummins, MD Authorized by: Waymond Lorelle Cummins, MD   Critical care provider statement:    Critical care time (minutes):  50   Critical care was necessary to treat or prevent imminent or life-threatening deterioration of the following conditions:  Circulatory failure and metabolic crisis   Critical care was time spent personally by me on the following activities:  Development of treatment plan with patient or surrogate, discussions with consultants, evaluation of patient's response to treatment, examination of patient, ordering and review of laboratory studies, ordering and review of radiographic studies, ordering and performing treatments and interventions, pulse oximetry, re-evaluation of patient's condition and review of old charts    MEDICATIONS ORDERED IN ED: Medications  prothrombin complex conc human (KCENTRA) IVPB 3,970 Units (has no administration in time range)  sodium chloride  0.9 % bolus 1,000 mL ( Intravenous Rate/Dose Verify 09/27/24 1431)  dextrose 5% lactated ringers bolus 500 mL (0 mLs Intravenous Stopped 09/27/24 1431)  fentaNYL (SUBLIMAZE) injection 25 mcg (25 mcg Intravenous Given 09/27/24 1432)     IMPRESSION / MDM / ASSESSMENT AND PLAN / ED COURSE  I reviewed the triage vital signs and the nursing notes.                              Differential diagnosis includes, but is not limited to, for the fall, consider intracranial hemorrhage, contusion, strain, sprain, fracture, for her weakness to consider atypical ACS, arrhythmia, UTI, pneumonia, colitis, diverticulitis, dehydration, electrolyte derangements.  Get labs, UA, CT chest abdomen pelvis, x-ray foot, CT head, cervical spine.  IV fluids, reassess.  Patient's presentation is most consistent with acute presentation with potential threat to life or bodily function.  Independent interpretation of labs and imaging below.  Clinical course as below.  Patient has been accepted to [facility], will go  directly to the OR.  They are sending their critical care truck.  I had added on type and screen, PT/INR, LFTs.  Updated patient and family and they are agreeable with this plan.  The patient is on the cardiac monitor to evaluate for evidence of arrhythmia and/or significant heart rate changes.   Clinical Course as of 09/27/24 1451  Fri Sep 27, 2024  1224 Independent review of labs, no leukocytosis,  troponin is elevated, her EKG without evidence of STEMI, her chest pain is more rib tenderness on palpation, suspect elevated troponin is secondary to demand.  AKI noted with metabolic acidosis with anion gap.  Suspect this might be due to dehydration given that she was on the floor since yesterday.  Will give her some dextrose containing fluids as well. [TT]  1228 CK Total(!): 3,931 Consistent with rhabdo.  She is getting fluids. [TT]  1258 DG Chest 2 View No acute cardiopulmonary findings.  [TT]  1259 DG Foot Complete Right IMPRESSION: 1. No acute osseous abnormality. 2. Diffuse osseous demineralization.   [TT]  1334 CT Cervical Spine Wo Contrast IMPRESSION: 1. No acute traumatic injury identified in the cervical spine. 2. Mild for age cervical spine degeneration.   [TT]  1334 CT Head Wo Contrast IMPRESSION: 1. Posterior vertex deep scalp soft tissue could be injury (sagittal image 32). Nearby superficial scalp scarring. Underlying calvarium intact. Recommend correlation with physical exam. 2. No acute intracranial abnormality. Moderate for age sequelae of cerebral small vessel disease.   [TT]  1405 Her scalp does not have any lacerations or open wounds. [TT]  1405 CT CHEST ABDOMEN PELVIS WO CONTRAST IMPRESSION: 1. Splenic rupture with hemoperitoneum and acute hemorrhage in the gastrosplenic ligament. 2. High-density blood tracking along the left paracolic gutter into the pelvis. 3. Three discrete pulmonary nodules, including a 10 x 7 mm ill-defined left upper lobe nodule and a  new 6 x 3 mm right lower lobe nodule; recommend management per Fleischner Society Guidelines based on risk, with consideration of non-contrast chest CT at 3 months, PET/CT, or tissue sampling for the 8.120 mm nodule.   [TT]  1405 Updated patient and son's about imaging and lab results including incidental findings of splenic rupture.  Will reach out to surgery. [TT]  1411 Consulted surgery who recommended transfer to [facility] since a bleeding is not controlled with IR, likely will need splenectomy with trauma surgery.  Patient states that she last took Eliquis more than 24 hours ago, did not take her nighttime or a.m. dose.  Discussed with patient and family about plan for transfer to [facility], discussed risk and benefits and they are agreeable with this plan..  Blood pressure in the room is systolic 110s. [TT]  1437 Consulted Dr. Sebastian from trauma surgery:, He states that he will check with the OR to see if they can take her directly to preop for splenectomy [TT]  1451 Surgery called back and asked if we could order Kcentra for patient, they are expediting getting her to the OR at this time.  Will order the Kcentra. [TT]    Clinical Course User Index [TT] Waymond Lorelle Cummins, MD     FINAL CLINICAL IMPRESSION(S) / ED DIAGNOSES   Final diagnoses:  Splenic rupture  Hemoperitoneum  Fall, initial encounter  Traumatic rhabdomyolysis, initial encounter  Elevated troponin  AKI (acute kidney injury)  Rib pain on left side  Pulmonary nodules     Rx / DC Orders   ED Discharge Orders     None        Note:  This document was prepared using Dragon voice recognition software and may include unintentional dictation errors.    Waymond Lorelle Cummins, MD 09/27/24 1449    Waymond Lorelle Cummins, MD 09/27/24 [phone]

## 2024-09-28 LAB — BASIC METABOLIC PANEL WITH GFR
Anion gap: 7 (ref 5–15)
Anion gap: 9 (ref 5–15)
BUN: 20 mg/dL (ref 8–23)
BUN: 24 mg/dL — ABNORMAL HIGH (ref 8–23)
CO2: 15 mmol/L — ABNORMAL LOW (ref 22–32)
CO2: 20 mmol/L — ABNORMAL LOW (ref 22–32)
Calcium: 5.5 mg/dL — CL (ref 8.9–10.3)
Calcium: 7.3 mg/dL — ABNORMAL LOW (ref 8.9–10.3)
Chloride: 115 mmol/L — ABNORMAL HIGH (ref 98–111)
Chloride: 106 mmol/L (ref 98–111)
Creatinine, Ser: 0.79 mg/dL (ref 0.44–1.00)
Creatinine, Ser: 1.07 mg/dL — ABNORMAL HIGH (ref 0.44–1.00)
GFR, Estimated: >60 mL/min (ref >60)
GFR, Estimated: 51 mL/min — ABNORMAL LOW (ref >60)
Glucose, Bld: 84 mg/dL (ref 70–99)
Glucose, Bld: 103 mg/dL — ABNORMAL HIGH (ref 70–99)
Potassium: 2.6 mmol/L — CL (ref 3.5–5.1)
Potassium: 3.3 mmol/L — ABNORMAL LOW (ref 3.5–5.1)
Sodium: 137 mmol/L (ref 135–145)
Sodium: 135 mmol/L (ref 135–145)

## 2024-09-28 LAB — PREPARE PLATELET PHERESIS: Unit division: 0

## 2024-09-28 LAB — CBC
HCT: 15.6 % — ABNORMAL LOW (ref 36.0–46.0)
HCT: 28.5 % — ABNORMAL LOW (ref 36.0–46.0)
HCT: 28.3 % — ABNORMAL LOW (ref 36.0–46.0)
Hemoglobin: 5.2 g/dL — CL (ref 12.0–15.0)
Hemoglobin: 10.3 g/dL — ABNORMAL LOW (ref 12.0–15.0)
Hemoglobin: 10 g/dL — ABNORMAL LOW (ref 12.0–15.0)
MCH: 29.4 pg (ref 26.0–34.0)
MCH: 29.3 pg (ref 26.0–34.0)
MCH: 29 pg (ref 26.0–34.0)
MCHC: 33.3 g/dL (ref 30.0–36.0)
MCHC: 36.1 g/dL — ABNORMAL HIGH (ref 30.0–36.0)
MCHC: 35.3 g/dL (ref 30.0–36.0)
MCV: 88.1 fL (ref 80.0–100.0)
MCV: 81.2 fL (ref 80.0–100.0)
MCV: 82 fL (ref 80.0–100.0)
Platelets: 61 K/uL — ABNORMAL LOW (ref 150–400)
Platelets: 114 K/uL — ABNORMAL LOW (ref 150–400)
Platelets: 121 K/uL — ABNORMAL LOW (ref 150–400)
RBC: 1.77 MIL/uL — ABNORMAL LOW (ref 3.87–5.11)
RBC: 3.51 MIL/uL — ABNORMAL LOW (ref 3.87–5.11)
RBC: 3.45 MIL/uL — ABNORMAL LOW (ref 3.87–5.11)
RDW: 16.5 % — ABNORMAL HIGH (ref 11.5–15.5)
RDW: 16.2 % — ABNORMAL HIGH (ref 11.5–15.5)
RDW: 16.2 % — ABNORMAL HIGH (ref 11.5–15.5)
WBC: 5.3 K/uL (ref 4.0–10.5)
WBC: 9.8 K/uL (ref 4.0–10.5)
WBC: 10.1 K/uL (ref 4.0–10.5)
nRBC: 0.6 % — ABNORMAL HIGH (ref 0.0–0.2)
nRBC: 0 % (ref 0.0–0.2)
nRBC: 0 % (ref 0.0–0.2)

## 2024-09-28 LAB — BPAM FFP
Blood Product Expiration Date: 202511192359
Blood Product Expiration Date: 202511192359
ISSUE DATE / TIME: 202511142142
ISSUE DATE / TIME: 202511142142
Unit Type and Rh: 5100
Unit Type and Rh: 5100

## 2024-09-28 LAB — TYPE AND SCREEN
ABO/RH(D): O POS
Antibody Screen: NEGATIVE
Unit division: 0
Unit division: 0
Unit division: 0
Unit division: 0

## 2024-09-28 LAB — PREPARE FRESH FROZEN PLASMA

## 2024-09-28 LAB — BPAM RBC
Blood Product Expiration Date: 202512122359
Blood Product Expiration Date: 202512122359
Blood Product Expiration Date: 202512122359
Blood Product Expiration Date: 202512122359
ISSUE DATE / TIME: 202511141725
ISSUE DATE / TIME: 202511141725
ISSUE DATE / TIME: 202511141740
ISSUE DATE / TIME: 202511141740
Unit Type and Rh: 5100
Unit Type and Rh: 5100
Unit Type and Rh: 5100
Unit Type and Rh: 5100

## 2024-09-28 LAB — BPAM PLATELET PHERESIS
Blood Product Expiration Date: 202511152359
ISSUE DATE / TIME: 202511142142
Unit Type and Rh: 6200

## 2024-09-28 LAB — CK: Total CK: 1513 U/L — ABNORMAL HIGH (ref 38–234)

## 2024-09-28 LAB — TROPONIN I (HIGH SENSITIVITY): Troponin I (High Sensitivity): 309 ng/L (ref <18)

## 2024-09-28 MED ORDER — POTASSIUM CHLORIDE 10 MEQ/100ML IV SOLN
10.0000 meq | INTRAVENOUS | Status: AC
  Administered 2024-09-28 (×6): 10 meq via INTRAVENOUS
  Filled 2024-09-28 (×6): qty 100

## 2024-09-28 MED ORDER — METHOCARBAMOL 500 MG PO TABS
500.0000 mg | ORAL_TABLET | Freq: Three times a day (TID) | ORAL | Status: DC
  Administered 2024-09-28 – 2024-09-30 (×9): 500 mg via ORAL
  Filled 2024-09-28 (×9): qty 1

## 2024-09-28 MED ORDER — MORPHINE SULFATE (PF) 2 MG/ML IV SOLN
2.0000 mg | INTRAVENOUS | Status: DC | PRN
  Administered 2024-09-28 – 2024-09-29 (×2): 2 mg via INTRAVENOUS
  Administered 2024-09-29: 4 mg via INTRAVENOUS
  Administered 2024-09-29 (×2): 2 mg via INTRAVENOUS
  Administered 2024-09-30 – 2024-10-02 (×7): 4 mg via INTRAVENOUS
  Filled 2024-09-28: qty 1
  Filled 2024-09-28 (×2): qty 2
  Filled 2024-09-28 (×2): qty 1
  Filled 2024-09-28 (×4): qty 2
  Filled 2024-09-28 (×2): qty 1
  Filled 2024-09-28 (×2): qty 2

## 2024-09-28 MED ORDER — OXYCODONE HCL 5 MG PO TABS
2.5000 mg | ORAL_TABLET | ORAL | Status: DC | PRN
  Administered 2024-09-28 – 2024-09-29 (×4): 5 mg via ORAL
  Filled 2024-09-28 (×5): qty 1

## 2024-09-28 MED ORDER — ACETAMINOPHEN 500 MG PO TABS
1000.0000 mg | ORAL_TABLET | Freq: Four times a day (QID) | ORAL | Status: DC
  Administered 2024-09-28 – 2024-10-09 (×42): 1000 mg via ORAL
  Filled 2024-09-28 (×44): qty 2

## 2024-09-28 MED ORDER — OXYCODONE HCL 5 MG PO TABS
5.0000 mg | ORAL_TABLET | ORAL | Status: DC | PRN
  Administered 2024-09-28: 5 mg via ORAL
  Filled 2024-09-28: qty 1

## 2024-09-28 MED ORDER — ORAL CARE MOUTH RINSE: 15.0000 mL | OROMUCOSAL | Status: DC | PRN

## 2024-09-28 MED ORDER — CHLORHEXIDINE GLUCONATE CLOTH 2 % EX PADS
6.0000 | MEDICATED_PAD | Freq: Every day | CUTANEOUS | Status: DC
  Administered 2024-09-28 – 2024-10-05 (×8): 6 via TOPICAL

## 2024-09-28 NOTE — Procedures (Signed)
 Extubation Procedure Note  Patient Details:   Name: Sarah Bird DOB: 1936-01-24 MRN: [id number]   Airway Documentation:    Vent end date: 09/28/24 Vent end time: 1010   Evaluation  O2 sats: stable throughout Complications: No apparent complications Patient did tolerate procedure well. Bilateral Breath Sounds: Clear   Yes  Extubated w/o complication to 6 lpm  Bari CHRISTELLA Daunt 09/28/2024, 10:14 AM

## 2024-09-28 NOTE — Progress Notes (Addendum)
 Follow up - Trauma Critical Care  Patient Details:    Sarah Bird is an 88 y.o. female.  Lines/tubes : Airway (Active)     NG/OG Vented/Dual Lumen 14 Fr. Oral Marking at nare/corner of mouth 60 cm (Active)  Tube Position (Required) Marking at nare/corner of mouth 09/28/24 0800  Measurement (cm) (Required) 60 cm 09/28/24 0800  Ongoing Placement Verification (Required) (See row information) Yes 09/28/24 0800  Site Assessment Clean, Dry, Intact 09/28/24 0800  Status Clamped 09/28/24 0800     Urethral Catheter Veda Prescott, RN Latex 14 Fr. (Active)  Indication for Insertion or Continuance of Catheter Therapy based on hourly urine output monitoring and documentation for critical condition (NOT STRICT I&O) 09/28/24 0721  Site Assessment Clean, Dry, Intact 09/28/24 0721  Catheter Maintenance Bag below level of bladder;Catheter secured;Drainage bag/tubing not touching floor;Insertion date on drainage bag;No dependent loops;Seal intact 09/28/24 0721  Collection Container Standard drainage bag 09/28/24 0721  Securement Method Adhesive securement device 09/28/24 0721  Output (mL) 60 mL 09/28/24 0600    Microbiology/Sepsis markers: Results for orders placed or performed during the hospital encounter of 09/27/24  MRSA Next Gen by PCR, Nasal     Status: None   Collection Time: 09/27/24  6:58 PM   Specimen: Nasal Mucosa; Nasal Swab  Result Value Ref Range Status   MRSA by PCR Next Gen NOT DETECTED NOT DETECTED Final    Comment: (NOTE) The GeneXpert MRSA Assay (FDA approved for NASAL specimens only), is one component of a comprehensive MRSA colonization surveillance program. It is not intended to diagnose MRSA infection nor to guide or monitor treatment for MRSA infections. Test performance is not FDA approved in patients less than 61 years old. Performed at [facility], [address] [place], [place] [address]     Anti-infectives:  Anti-infectives (From admission, onward)     Start     Dose/Rate Route Frequency Ordered Stop   09/27/24 1645  ceFAZolin (ANCEF) IVPB 1 g/50 mL premix        1 g 100 mL/hr over 30 Minutes Intravenous  Once 09/27/24 1641 09/27/24 1715        Consults:  None currently    Studies: KUB overnight with OGT in gastric body   Events:  None overnight   Subjective:    Overnight Issues:  No acute events. Pt awake and alert on the vent and able to follow commands. Able to nod to questions. Having abdominal pain and some headache. Family member updated at bedside.  Objective:  Vital signs for last 24 hours: Temp:  [97 F (36.1 C)-97.7 F (36.5 C)] 97 F (36.1 C) (11/15 0719) Pulse Rate:  [67-100] 67 (11/15 0800) Resp:  [16-29] 20 (11/15 0800) BP: (96-151)/(54-90) 124/81 (11/15 0400) SpO2:  [93 %-100 %] 100 % (11/15 0800) Arterial Line BP: (75-159)/(52-96) 113/52 (11/15 0800) FiO2 (%):  [60 %-100 %] 60 % (11/15 0400) Weight:  [79.4 kg] 79.4 kg (11/14 1120)  Hemodynamic parameters for last 24 hours:    Intake/Output from previous day: 11/14 0701 - 11/15 0700 In: 5615.4 [I.V.:2468.6; Blood:1901; IV Piggyback:1245.8] Out: 2595 [Urine:995; Blood:1600]  Intake/Output this shift: Total I/O In: 107.9 [I.V.:107.9] Out: -   Vent settings for last 24 hours: Vent Mode: PRVC FiO2 (%):  [60 %-100 %] 60 % Set Rate:  [20 bmp] 20 bmp Vt Set:  [470 mL] 470 mL PEEP:  [5 cmH20] 5 cmH20 Plateau Pressure:  [14 cmH20-16 cmH20] 16 cmH20  Physical Exam:  General: pleasant, WD, overweight female who is laying in bed intubated HEENT: Pupils equal and round Heart: regular, rate, and rhythm. Palpable radial and pedal pulses bilaterally Lungs: Intubated Abd: soft, appropriately ttp, honeycomb to midline incision, mild distention, OGT with minimal output  MS: all 4 extremities are symmetrical with no cyanosis, clubbing, or edema. Skin: warm and dry with no masses, lesions, or rashes Neuro: alert, following commands, responding to  questions appropriately     Results for orders placed or performed during the hospital encounter of 09/27/24 (from the past 24 hours)  Type and screen     Status: None   Collection Time: 09/27/24  4:31 PM  Result Value Ref Range   ABO/RH(D) O POS    Antibody Screen NEG    Sample Expiration 09/30/2024,2359    Unit Number [id number]    Blood Component Type RED CELLS,LR    Unit division 00    Status of Unit ISSUED,FINAL    Transfusion Status OK TO TRANSFUSE    Crossmatch Result Compatible    Unit Number [id number]    Blood Component Type RED CELLS,LR    Unit division 00    Status of Unit ISSUED,FINAL    Transfusion Status OK TO TRANSFUSE    Crossmatch Result Compatible    Unit Number [id number]    Blood Component Type RED CELLS,LR    Unit division 00    Status of Unit ISSUED,FINAL    Transfusion Status OK TO TRANSFUSE    Crossmatch Result      Compatible Performed at [facility], [address] [place], [place] [address]    Unit Number [id number]    Blood Component Type RED CELLS,LR    Unit division 00    Status of Unit ISSUED,FINAL    Transfusion Status OK TO TRANSFUSE    Crossmatch Result Compatible   Prepare RBC (crossmatch)     Status: None   Collection Time: 09/27/24  5:25 PM  Result Value Ref Range   Order Confirmation      ORDER PROCESSED BY BLOOD BANK Performed at [facility], [address], [place], [place] [address]   I-STAT 7, (LYTES, BLD GAS, ICA, H+H)     Status: Abnormal   Collection Time: 09/27/24  6:12 PM  Result Value Ref Range   pH, Arterial 7.244 (L) 7.35 - 7.45   pCO2 arterial 38.2 32 - 48 mmHg   pO2, Arterial 244 (H) 83 - 108 mmHg   Bicarbonate 16.5 (L) 20.0 - 28.0 mmol/L   TCO2 18 (L) 22 - 32 mmol/L   O2 Saturation 100 %   Acid-base deficit 10.0 (H) 0.0 - 2.0 mmol/L   Sodium 138 135 - 145 mmol/L   Potassium 3.8 3.5 - 5.1 mmol/L   Calcium, Ion 0.89 (LL) 1.15 - 1.40 mmol/L   HCT 21.0 (L) 36.0 - 46.0 %   Hemoglobin  7.1 (L) 12.0 - 15.0 g/dL   Sample type ARTERIAL   MRSA Next Gen by PCR, Nasal     Status: None   Collection Time: 09/27/24  6:58 PM   Specimen: Nasal Mucosa; Nasal Swab  Result Value Ref Range   MRSA by PCR Next Gen NOT DETECTED NOT DETECTED  CBC     Status: Abnormal   Collection Time: 09/27/24  7:41 PM  Result Value Ref Range   WBC 9.4 4.0 - 10.5 K/uL   RBC 4.37 3.87 - 5.11 MIL/uL   Hemoglobin 12.5 12.0 - 15.0 g/dL  HCT 36.3 36.0 - 46.0 %   MCV 83.1 80.0 - 100.0 fL   MCH 28.6 26.0 - 34.0 pg   MCHC 34.4 30.0 - 36.0 g/dL   RDW 85.0 88.4 - 84.4 %   Platelets 84 (L) 150 - 400 K/uL   nRBC 0.0 0.0 - 0.2 %  Basic metabolic panel     Status: Abnormal   Collection Time: 09/27/24  7:41 PM  Result Value Ref Range   Sodium 135 135 - 145 mmol/L   Potassium 3.2 (L) 3.5 - 5.1 mmol/L   Chloride 106 98 - 111 mmol/L   CO2 16 (L) 22 - 32 mmol/L   Glucose, Bld 134 (H) 70 - 99 mg/dL   BUN 31 (H) 8 - 23 mg/dL   Creatinine, Ser 8.51 (H) 0.44 - 1.00 mg/dL   Calcium 6.8 (L) 8.9 - 10.3 mg/dL   GFR, Estimated 34 (L) >60 mL/min   Anion gap 13 5 - 15  Troponin I (High Sensitivity)     Status: Abnormal   Collection Time: 09/27/24  7:41 PM  Result Value Ref Range   Troponin I (High Sensitivity) 460 (HH) <18 ng/L  Protime-INR     Status: None   Collection Time: 09/27/24  7:41 PM  Result Value Ref Range   Prothrombin Time 14.8 11.4 - 15.2 seconds   INR 1.1 0.8 - 1.2  I-STAT 7, (LYTES, BLD GAS, ICA, H+H)     Status: Abnormal   Collection Time: 09/27/24  7:43 PM  Result Value Ref Range   pH, Arterial 7.327 (L) 7.35 - 7.45   pCO2 arterial 30.1 (L) 32 - 48 mmHg   pO2, Arterial 450 (H) 83 - 108 mmHg   Bicarbonate 15.8 (L) 20.0 - 28.0 mmol/L   TCO2 17 (L) 22 - 32 mmol/L   O2 Saturation 100 %   Acid-base deficit 9.0 (H) 0.0 - 2.0 mmol/L   Sodium 137 135 - 145 mmol/L   Potassium 3.3 (L) 3.5 - 5.1 mmol/L   Calcium, Ion 1.01 (L) 1.15 - 1.40 mmol/L   HCT 35.0 (L) 36.0 - 46.0 %   Hemoglobin 11.9 (L)  12.0 - 15.0 g/dL   Patient temperature 02.2 F    Sample type ARTERIAL   Glucose, capillary     Status: Abnormal   Collection Time: 09/27/24  7:44 PM  Result Value Ref Range   Glucose-Capillary 137 (H) 70 - 99 mg/dL  Prepare fresh frozen plasma     Status: None   Collection Time: 09/27/24  9:00 PM  Result Value Ref Range   Unit Number [id number]    Blood Component Type THW PLS APHR    Unit division A0    Status of Unit ISSUED,FINAL    Transfusion Status OK TO TRANSFUSE    Unit Number [id number]    Blood Component Type THW PLS APHR    Unit division B0    Status of Unit ISSUED,FINAL    Transfusion Status      OK TO TRANSFUSE Performed at [facility], [address] [place], [place] [address]   Prepare platelet pheresis     Status: None   Collection Time: 09/27/24  9:00 PM  Result Value Ref Range   Unit Number [id number]    Blood Component Type PLTPH LR2 7D    Unit division 00    Status of Unit ISSUED,FINAL    Transfusion Status      OK TO TRANSFUSE Performed at [facility],  [address], [place], [place] [address]   CK     Status: Abnormal   Collection Time: 09/27/24  9:33 PM  Result Value Ref Range   Total CK 2,391 (H) 38 - 234 U/L  Troponin I (High Sensitivity)     Status: Abnormal   Collection Time: 09/27/24 10:42 PM  Result Value Ref Range   Troponin I (High Sensitivity) 368 (HH) <18 ng/L  Troponin I (High Sensitivity)     Status: Abnormal   Collection Time: 09/28/24  1:02 AM  Result Value Ref Range   Troponin I (High Sensitivity) 309 (HH) <18 ng/L  Basic metabolic panel     Status: Abnormal   Collection Time: 09/28/24  4:38 AM  Result Value Ref Range   Sodium 137 135 - 145 mmol/L   Potassium 2.6 (LL) 3.5 - 5.1 mmol/L   Chloride 115 (H) 98 - 111 mmol/L   CO2 15 (L) 22 - 32 mmol/L   Glucose, Bld 84 70 - 99 mg/dL   BUN 20 8 - 23 mg/dL   Creatinine, Ser 9.20 0.44 - 1.00 mg/dL   Calcium 5.5 (LL) 8.9 - 10.3 mg/dL   GFR, Estimated >39 >39  mL/min   Anion gap 7 5 - 15  CBC     Status: Abnormal   Collection Time: 09/28/24  4:38 AM  Result Value Ref Range   WBC 5.3 4.0 - 10.5 K/uL   RBC 1.77 (L) 3.87 - 5.11 MIL/uL   Hemoglobin 5.2 (LL) 12.0 - 15.0 g/dL   HCT 84.3 (L) 63.9 - 53.9 %   MCV 88.1 80.0 - 100.0 fL   MCH 29.4 26.0 - 34.0 pg   MCHC 33.3 30.0 - 36.0 g/dL   RDW 83.4 (H) 88.4 - 84.4 %   Platelets 61 (L) 150 - 400 K/uL   nRBC 0.6 (H) 0.0 - 0.2 %  CK     Status: Abnormal   Collection Time: 09/28/24  4:38 AM  Result Value Ref Range   Total CK 1,513 (H) 38 - 234 U/L  CBC     Status: Abnormal   Collection Time: 09/28/24  6:07 AM  Result Value Ref Range   WBC 9.8 4.0 - 10.5 K/uL   RBC 3.51 (L) 3.87 - 5.11 MIL/uL   Hemoglobin 10.3 (L) 12.0 - 15.0 g/dL   HCT 71.4 (L) 63.9 - 53.9 %   MCV 81.2 80.0 - 100.0 fL   MCH 29.3 26.0 - 34.0 pg   MCHC 36.1 (H) 30.0 - 36.0 g/dL   RDW 83.7 (H) 88.4 - 84.4 %   Platelets 114 (L) 150 - 400 K/uL   nRBC 0.0 0.0 - 0.2 %  Basic metabolic panel     Status: Abnormal   Collection Time: 09/28/24  6:31 AM  Result Value Ref Range   Sodium 135 135 - 145 mmol/L   Potassium 3.3 (L) 3.5 - 5.1 mmol/L   Chloride 106 98 - 111 mmol/L   CO2 20 (L) 22 - 32 mmol/L   Glucose, Bld 103 (H) 70 - 99 mg/dL   BUN 24 (H) 8 - 23 mg/dL   Creatinine, Ser 8.92 (H) 0.44 - 1.00 mg/dL   Calcium 7.3 (L) 8.9 - 10.3 mg/dL   GFR, Estimated 51 (L) >60 mL/min   Anion gap 9 5 - 15    Assessment & Plan: Present on Admission: **None** 88 y/o F s/p GLF on Eliquis  Ruptured spleen with 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  Sebastian, will need splenectomy vaccines prior to discharge ABL anemia - due to above;  K Centra ordered by outside hospital at 1500 but, unfortunately, the patient was sent via carelink without it. Hgb was 9.7 pre-op she is 10.3 this AM, continue to monitor closely  Posterior scalp hematoma  Rhabdomyolysis - CK downtrending, continue bicarb for now and monitor AKI - BUN 24 frok 40,  Cr 1.07 from 2.28; UOP good Elevated troponin - trending back sown, suspect demand ischemia, continue cardiac monitoring; EKG with afib without ST changes; Acute post-operative VDRF - doing well this AM, plan to extubate   HTN Afib - hold DOAC;   FEN - ice chips/sips once extubated. IVF VTE - SCD's, hold chemical VTE in the setting of active bleeding ID - got ancef periop  Dispo: ICU and monitor here today, ok to extubate. OK to have ice chips and sips of clears as long as no concerns on bedside swallow    LOS: 1 day   Additional comments:I reviewed the patient's new clinical lab test results.  , I have discussed and reviewed with family members patient's current clinical status and plan, and I discussed the patient's current condition and care plan with Dr. Lonni Pizza.  Critical Care Total Time*: 30 Minutes  Burnard JONELLE Louder , [place] [facility] Please see Amion for pager number during day hours 7:00am-4:30pm  09/28/2024  *Care during the described time interval was provided by me. I have reviewed this patient's available data, including medical history, events of note, physical examination and test results as part of my evaluation.

## 2024-09-29 LAB — BASIC METABOLIC PANEL WITH GFR
Anion gap: 11 (ref 5–15)
BUN: 11 mg/dL (ref 8–23)
CO2: 23 mmol/L (ref 22–32)
Calcium: 7.6 mg/dL — ABNORMAL LOW (ref 8.9–10.3)
Chloride: 99 mmol/L (ref 98–111)
Creatinine, Ser: 0.66 mg/dL (ref 0.44–1.00)
GFR, Estimated: >60 mL/min (ref >60)
Glucose, Bld: 82 mg/dL (ref 70–99)
Potassium: 3.2 mmol/L — ABNORMAL LOW (ref 3.5–5.1)
Sodium: 133 mmol/L — ABNORMAL LOW (ref 135–145)

## 2024-09-29 LAB — CBC
HCT: 28.8 % — ABNORMAL LOW (ref 36.0–46.0)
Hemoglobin: 10.2 g/dL — ABNORMAL LOW (ref 12.0–15.0)
MCH: 29.4 pg (ref 26.0–34.0)
MCHC: 35.4 g/dL (ref 30.0–36.0)
MCV: 83 fL (ref 80.0–100.0)
Platelets: 115 K/uL — ABNORMAL LOW (ref 150–400)
RBC: 3.47 MIL/uL — ABNORMAL LOW (ref 3.87–5.11)
RDW: 16.3 % — ABNORMAL HIGH (ref 11.5–15.5)
WBC: 10.4 K/uL (ref 4.0–10.5)
nRBC: 0.2 % (ref 0.0–0.2)

## 2024-09-29 MED ORDER — POTASSIUM CHLORIDE CRYS ER 20 MEQ PO TBCR
40.0000 meq | EXTENDED_RELEASE_TABLET | ORAL | Status: AC
  Administered 2024-09-29 (×2): 40 meq via ORAL
  Filled 2024-09-29 (×2): qty 2

## 2024-09-29 MED ORDER — MECLIZINE HCL 12.5 MG PO TABS
12.5000 mg | ORAL_TABLET | Freq: Three times a day (TID) | ORAL | Status: DC | PRN
  Administered 2024-09-29: 12.5 mg via ORAL
  Filled 2024-09-29 (×2): qty 1

## 2024-09-29 MED ORDER — OXYCODONE HCL 5 MG PO TABS
5.0000 mg | ORAL_TABLET | ORAL | Status: DC | PRN
  Administered 2024-09-29 – 2024-10-01 (×6): 10 mg via ORAL
  Filled 2024-09-29 (×3): qty 2
  Filled 2024-09-29: qty 1
  Filled 2024-09-29 (×2): qty 2

## 2024-09-29 NOTE — Progress Notes (Signed)
 Transition of Care [facility]) - CAGE-AID Screening   Patient Details  Name: Sarah Bird MRN: [id number] Date of Birth: 04-14-1936   Darice [name], RN Trauma Response Nurse Phone Number: [phone] 09/29/2024, 4:37 PM     CAGE-AID Screening:    Have You Ever Felt You Ought to Cut Down on Your Drinking or Drug Use?: No Have People Annoyed You By Critizing Your Drinking Or Drug Use?: No Have You Felt Bad Or Guilty About Your Drinking Or Drug Use?: No Have You Ever Had a Drink or Used Drugs First Thing In The Morning to Steady Your Nerves or to Get Rid of a Hangover?: No CAGE-AID Score: 0  Substance Abuse Education Offered: (S) No (pt does not drink etoh- declines services)

## 2024-09-29 NOTE — Plan of Care (Signed)

## 2024-09-29 NOTE — Progress Notes (Signed)
 Progress Note  2 Days Post-Op  Subjective: Pt reports some abdominal pain that is controlled with medications, did require some IV pain meds overnight. Denies nausea or vomiting. Does feel like the room is spinning occasionally. Family member at bedside.   Objective: Vital signs in last 24 hours: Temp:  [96.5 F (35.8 C)-99.2 F (37.3 C)] 98.2 F (36.8 C) (11/16 0320) Pulse Rate:  [66-104] 104 (11/16 0645) Resp:  [13-26] 16 (11/16 0645) BP: (79-147)/(51-91) 139/89 (11/16 0600) SpO2:  [75 %-100 %] 97 % (11/16 0645) Arterial Line BP: (87-165)/(44-139) 162/90 (11/16 0645) FiO2 (%):  [40 %] 40 % (11/15 0847) Last BM Date :  (pta)  Intake/Output from previous day: 11/15 0701 - 11/16 0700 In: 1126.9 [I.V.:972.5; NG/GT:80; IV Piggyback:74.4] Out: 2775 [Urine:2775] Intake/Output this shift: No intake/output data recorded.  PE: General: pleasant, WD, overweight female who is laying in bed, NAD Heart: regular, rate, and rhythm.  Lungs: normal effort on nasal cannula  Abd: soft, appropriately ttp, honeycomb to midline incision, mild distention Neuro: alert, following commands, responding to questions appropriately    Lab Results:  Recent Labs    09/28/24 1209 09/29/24 0440  WBC 10.1 10.4  HGB 10.0* 10.2*  HCT 28.3* 28.8*  PLT 121* 115*   BMET Recent Labs    09/28/24 0631 09/29/24 0440  NA 135 133*  K 3.3* 3.2*  CL 106 99  CO2 20* 23  GLUCOSE 103* 82  BUN 24* 11  CREATININE 1.07* 0.66  CALCIUM 7.3* 7.6*   PT/INR Recent Labs    09/27/24 1941  LABPROT 14.8  INR 1.1   CMP     Component Value Date/Time   NA 133 (L) 09/29/2024 0440   K 3.2 (L) 09/29/2024 0440   CL 99 09/29/2024 0440   CO2 23 09/29/2024 0440   GLUCOSE 82 09/29/2024 0440   BUN 11 09/29/2024 0440   CREATININE 0.66 09/29/2024 0440   CALCIUM 7.6 (L) 09/29/2024 0440   PROT 4.8 (L) 09/27/2024 1437   ALBUMIN 3.1 (L) 09/27/2024 1437   AST 113 (H) 09/27/2024 1437   ALT 41 09/27/2024 1437    ALKPHOS 56 09/27/2024 1437   BILITOT 0.3 09/27/2024 1437   GFRNONAA >60 09/29/2024 0440   Lipase  No results found for: LIPASE     Studies/Results: DG Abd 1 View Result Date: 09/27/2024 EXAM: 1 VIEW XRAY OF THE ABDOMEN 09/27/2024 11:26:04 PM COMPARISON: CT abdomen/pelvis dated 09/27/2024. CLINICAL HISTORY: Encounter for orogastric (OG) tube placement. FINDINGS: LINES, TUBES AND DEVICES: Enteric tube in place with tip in lower gastric body. BOWEL: Nonobstructive bowel gas pattern. SOFT TISSUES: Surgical clips in left upper quadrant. Midline laparotomy surgical skin staples noted. No opaque urinary calculi. BONES: No acute osseous abnormality. IMPRESSION: 1. Enteric tube in place with tip in the lower gastric body. Electronically signed by: Pinkie Pebbles MD 09/27/2024 11:30 PM EST RP Workstation: [id number]   DG OR LOCAL ABDOMEN Addendum Date: 09/27/2024 ADDENDUM #1  ADDENDUM: Findings discussed with OR staff by Dr. Margarite over the phone on 09/27/2024 at 6:34 pm. Per staff, sponge was found external to patient and count now correct. ---------------------------------------------------- Electronically signed by: Morgane Naveau MD 09/27/2024 06:34 PM EST RP Workstation: [id number]   Result Date: 09/27/2024 ORIGINAL REPORT  EXAM: 1 VIEW XRAY OF THE ABDOMEN 09/27/2024 06:23:00 PM COMPARISON: None available. CLINICAL HISTORY: [id number] Surgery, elective [id number] Surgery, elective [phone] FINDINGS: BOWEL: Nonobstructive bowel gas pattern. SOFT TISSUES: Left upper quadrant surgical clips. No unexpected retained radiopaque foreign body  noted within the abdomen. No opaque urinary calculi. Pelvis collimated off view. Atherosclerotic plaque. BONES: No acute osseous abnormality. IMPRESSION: 1. No unexpected retained radiopaque foreign body within the abdomen. 2. Pelvis collimated off view. 3. Called OR 9 but no response. Pending call back. Thank you. Electronically signed by: Morgane Naveau MD 09/27/2024 06:30  PM EST RP Workstation: [id number]   CT CHEST ABDOMEN PELVIS WO CONTRAST Result Date: 09/27/2024 EXAM: CT CHEST, ABDOMEN AND PELVIS WITHOUT CONTRAST 09/27/2024 01:11:49 PM TECHNIQUE: CT of the chest, abdomen and pelvis was performed without the administration of intravenous contrast. Multiplanar reformatted images are provided for review. Automated exposure control, iterative reconstruction, and/or weight based adjustment of the mA/kV was utilized to reduce the radiation dose to as low as reasonably achievable. COMPARISON: None available. CLINICAL HISTORY: Polytrauma, blunt. FINDINGS: CHEST: MEDIASTINUM AND LYMPH NODES: Heart and pericardium are unremarkable. The central airways are clear. No mediastinal, hilar or axillary lymphadenopathy. LUNGS AND PLEURA: 3 discrete pulmonary nodules are present. A nodule in the left lower lobe on imaging 96 of series 5 measures 5 x 4 mm. An ill defined nodule is present within the inferior left upper lobe on image 102 measuring 10 x 7 mm. A new high density nodule in the right lower lobe superior segment measures 6 x 3 mm on image 84 of series 5. Mild dependent atelectasis is present bilaterally. No pleural effusion or pneumothorax. ABDOMEN AND PELVIS: LIVER: The liver is unremarkable. GALLBLADDER AND BILE DUCTS: Gallbladder is unremarkable. No biliary ductal dilatation. SPLEEN: Splenic rupture is present. High density material is present in the expected hilum of the spleen, consistent with hemorrhage. Diffuse stranding present in the adjacent fat. Acute hemorrhage is present in the gastrosplenic ligament. PANCREAS: No acute abnormality. ADRENAL GLANDS: No acute abnormality. KIDNEYS, URETERS AND BLADDER: No stones in the kidneys or ureters. No hydronephrosis. No perinephric or periureteral stranding. Urinary bladder is unremarkable. GI AND BOWEL: Stomach demonstrates no acute abnormality. Inflammatory changes are present in the splenic flexure of the colon. There is no bowel  obstruction. REPRODUCTIVE ORGANS: No acute abnormality. PERITONEUM AND RETROPERITONEUM: Hemoperitoneum is noted. High density blood extends along the left paracolic gutter and into the anatomic pelvis. No free air. VASCULATURE: Aorta is normal in caliber. Atherosclerotic calcifications are present in the aorta and branch vessels without aneurysm. ABDOMINAL AND PELVIS LYMPH NODES: No lymphadenopathy. BONES AND SOFT TISSUES: Multilevel degenerative changes in the lower lumbar spine are greatest at L3-L4 and L5-S1. No acute osseous abnormality. No focal soft tissue abnormality. IMPRESSION: 1. Splenic rupture with hemoperitoneum and acute hemorrhage in the gastrosplenic ligament. 2. High-density blood tracking along the left paracolic gutter into the pelvis. 3. Three discrete pulmonary nodules, including a 10 x 7 mm ill-defined left upper lobe nodule and a new 6 x 3 mm right lower lobe nodule; recommend management per Fleischner Society Guidelines based on risk, with consideration of non-contrast chest CT at 3 months, PET/CT, or tissue sampling for the 8.120 mm nodule. PET CT critical value findings were called to Dr. Waymond at 1:57 pm Electronically signed by: Lonni Necessary MD 09/27/2024 01:59 PM EST RP Workstation: [id number]   CT Cervical Spine Wo Contrast Result Date: 09/27/2024 EXAM: CT CERVICAL SPINE WITHOUT CONTRAST 09/27/2024 01:11:49 PM TECHNIQUE: CT of the cervical spine was performed without the administration of intravenous contrast. Multiplanar reformatted images are provided for review. Automated exposure control, iterative reconstruction, and/or weight based adjustment of the mA/kV was utilized to reduce the radiation dose to as low as reasonably  achievable. COMPARISON: CT head 09/27/2024, reported separately. CLINICAL HISTORY: Patient is >= 38 years old. Neck trauma. FINDINGS: CERVICAL SPINE: BONES AND ALIGNMENT: Maintained cervical lordosis. Mild degenerative appearing anterolisthesis of C4 on  C5. No acute fracture or traumatic malalignment. DEGENERATIVE CHANGES: Generally mild for age cervical spine degeneration. Mild degenerative appearing anterolisthesis of C4 on C5. No CT evidence of cervical spinal stenosis. SOFT TISSUES: No prevertebral soft tissue swelling. Negative for age visible non-contrast neck soft tissues; Asymmetric left neck level 3 lymph nodes but they remain within normal limits by size criteria. IMPRESSION: 1. No acute traumatic injury identified in the cervical spine. 2. Mild for age cervical spine degeneration. Electronically signed by: Helayne Hurst MD 09/27/2024 01:24 PM EST RP Workstation: [id number]   CT Head Wo Contrast Result Date: 09/27/2024 EXAM: CT HEAD WITHOUT CONTRAST 09/27/2024 01:11:49 PM TECHNIQUE: CT of the head was performed without the administration of intravenous contrast. Automated exposure control, iterative reconstruction, and/or weight based adjustment of the mA/kV was utilized to reduce the radiation dose to as low as reasonably achievable. COMPARISON: None available. CLINICAL HISTORY: 88 year old female with minor head trauma and recent Mohs surgery. FINDINGS: BRAIN AND VENTRICLES: No acute hemorrhage. No evidence of acute infarct. No hydrocephalus. No extra-axial collection. No mass effect or midline shift. Normal brain volume for age. Moderate for age heterogeneous hypodensity in the bilateral deep white matter capsules and the bilateral deep gray nuclei compatible with small vessel disease. No cortical encephalomalacia identified. No suspicious intracranial vascular hyperdensity. Mild for age calcified atherosclerosis at the skull base. ORBITS: Postoperative changes to the globes. No acute orbit injury identified. SINUSES: Visible paranasal sinuses, middle ears and mastoids are well aerated. SOFT TISSUES AND SKULL: Broad based posterior vertex deep scalp soft tissue swelling might be related to injury, uncertain (sagittal image 32). Nearby superficial  scalp soft tissue scarring. Underlying calvarium intact. No skull fracture. IMPRESSION: 1. Posterior vertex deep scalp soft tissue could be injury (sagittal image 32). Nearby superficial scalp scarring. Underlying calvarium intact. Recommend correlation with physical exam. 2. No acute intracranial abnormality. Moderate for age sequelae of cerebral small vessel disease. Electronically signed by: Helayne Hurst MD 09/27/2024 01:20 PM EST RP Workstation: [id number]   DG Chest 2 View Result Date: 09/27/2024 CLINICAL DATA:  Pain. EXAM: CHEST - 2 VIEW COMPARISON:  None Available. FINDINGS: The heart size and mediastinal contours are within normal limits. No focal consolidation, pleural effusion, or pneumothorax. Diffuse osseous demineralization. No acute osseous abnormality. IMPRESSION: No acute cardiopulmonary findings. Electronically Signed   By: Harrietta Sherry M.D.   On: 09/27/2024 12:37   DG Foot Complete Right Result Date: 09/27/2024 CLINICAL DATA:  Pain. EXAM: RIGHT FOOT COMPLETE - 3+ VIEW COMPARISON:  None Available. FINDINGS: Diffuse osseous demineralization. No acute fracture or dislocation. Minimal first MTP joint osteoarthritis. Soft tissues are unremarkable. IMPRESSION: 1. No acute osseous abnormality. 2. Diffuse osseous demineralization. Electronically Signed   By: Harrietta Sherry M.D.   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 Sebastian, will need splenectomy vaccines prior to discharge ABL anemia -  due to above;  K Centra ordered by outside hospital at 1500 but, unfortunately, the patient was sent via [facility] without it. Hgb was  9.7 pre-op, hgb stable at 10.2 this AM Posterior scalp hematoma  Rhabdomyolysis - CK downtrending, continue bicarb for now and monitor AKI - Improving, Cr 0.6 this AM; UOP good Elevated troponin - trended back down, suspect demand ischemia, continue cardiac monitoring; EKG with afib without ST changes; Acute post-operative VDRF - extubated 11/15 and doing well Hypokalemia - K 2.8, IV and PO replacement   HTN - holding home meds for now, will resume when appropriate Afib - hold DOAC;   FEN - CLD, decrease bicarb to 50 cc/h, replace potassium VTE - SCD's, hold chemical VTE in the setting of ABL anemia ID - got ancef periop Foley - TOV today    Dispo: Transfer to PCU. DC foley and art line. Ok for CLD. PT/OT   LOS: 2 days    Burnard JONELLE Louder, [facility] 09/29/2024, 8:08 AM Please see Amion for pager number during day hours 7:00am-4:30pm

## 2024-09-30 ENCOUNTER — Encounter (HOSPITAL_COMMUNITY): Payer: Self-pay | Admitting: General Surgery

## 2024-09-30 LAB — CBC
HCT: 33.8 % — ABNORMAL LOW (ref 36.0–46.0)
Hemoglobin: 11.5 g/dL — ABNORMAL LOW (ref 12.0–15.0)
MCH: 28.5 pg (ref 26.0–34.0)
MCHC: 34 g/dL (ref 30.0–36.0)
MCV: 83.9 fL (ref 80.0–100.0)
Platelets: 147 K/uL — ABNORMAL LOW (ref 150–400)
RBC: 4.03 MIL/uL (ref 3.87–5.11)
RDW: 15.8 % — ABNORMAL HIGH (ref 11.5–15.5)
WBC: 11.5 K/uL — ABNORMAL HIGH (ref 4.0–10.5)
nRBC: 0.3 % — ABNORMAL HIGH (ref 0.0–0.2)

## 2024-09-30 LAB — BASIC METABOLIC PANEL WITH GFR
Anion gap: 15 (ref 5–15)
BUN: 7 mg/dL — ABNORMAL LOW (ref 8–23)
CO2: 21 mmol/L — ABNORMAL LOW (ref 22–32)
Calcium: 8.3 mg/dL — ABNORMAL LOW (ref 8.9–10.3)
Chloride: 97 mmol/L — ABNORMAL LOW (ref 98–111)
Creatinine, Ser: 0.66 mg/dL (ref 0.44–1.00)
GFR, Estimated: >60 mL/min (ref >60)
Glucose, Bld: 91 mg/dL (ref 70–99)
Potassium: 3.2 mmol/L — ABNORMAL LOW (ref 3.5–5.1)
Sodium: 133 mmol/L — ABNORMAL LOW (ref 135–145)

## 2024-09-30 LAB — MAGNESIUM: Magnesium: 1.6 mg/dL — ABNORMAL LOW (ref 1.7–2.4)

## 2024-09-30 LAB — CK: Total CK: 401 U/L — ABNORMAL HIGH (ref 38–234)

## 2024-09-30 MED ORDER — POTASSIUM CHLORIDE 20 MEQ PO PACK
40.0000 meq | PACK | Freq: Once | ORAL | Status: AC
  Administered 2024-09-30: 40 meq via ORAL
  Filled 2024-09-30: qty 2

## 2024-09-30 MED ORDER — DILTIAZEM HCL ER COATED BEADS 120 MG PO CP24
120.0000 mg | ORAL_CAPSULE | Freq: Every day | ORAL | Status: DC
  Administered 2024-09-30 – 2024-10-09 (×10): 120 mg via ORAL
  Filled 2024-09-30 (×11): qty 1

## 2024-09-30 MED ORDER — MAGNESIUM SULFATE 2 GM/50ML IV SOLN
2.0000 g | Freq: Once | INTRAVENOUS | Status: AC
  Administered 2024-09-30: 2 g via INTRAVENOUS
  Filled 2024-09-30: qty 50

## 2024-09-30 MED ORDER — FUROSEMIDE 20 MG PO TABS
20.0000 mg | ORAL_TABLET | Freq: Every day | ORAL | Status: DC
  Administered 2024-09-30 – 2024-10-03 (×3): 20 mg via ORAL
  Filled 2024-09-30 (×4): qty 1

## 2024-09-30 MED ORDER — LABETALOL HCL 5 MG/ML IV SOLN
10.0000 mg | INTRAVENOUS | Status: DC | PRN
  Administered 2024-09-30: 10 mg via INTRAVENOUS
  Filled 2024-09-30: qty 4

## 2024-09-30 NOTE — Plan of Care (Signed)

## 2024-09-30 NOTE — Evaluation (Signed)
 Occupational Therapy Evaluation Patient Details Name: Sarah Bird MRN: [id number] DOB: 1936-08-21 Today's Date: 09/30/2024   History of Present Illness   88 y/o F presenting to [facility] on 11/14 after fall, workup significant for posterior scalp soft tissue injury, splenic rupture with hemoperitoneum and acute hemorrhage in gastrosplenic ligament. Transferred to 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    PMH includes A fib on Eliquis, HTN, basal cell carcinoma, UTI     Clinical Impressions Pt ind at baseline with ADLs/functional mobility, lives alone but has her children who check in on her. Pt currently limited by nausea, needs up to mod A for ADLs, unable to stand from recliner this session due to nausea, pt dry heaving and requests to return to sitting in chair. Pt presenting with impairments listed below, will follow acutely. Plan to return later today for second session -- schedule permitting, to get better idea of pt's functional level at this time.      If plan is discharge home, recommend the following:   A little help with walking and/or transfers;A lot of help with bathing/dressing/bathroom;Assistance with cooking/housework;Assist for transportation;Help with stairs or ramp for entrance     Functional Status Assessment   Patient has had a recent decline in their functional status and demonstrates the ability to make significant improvements in function in a reasonable and predictable amount of time.     Equipment Recommendations   Other (comment) (TBD)     Recommendations for Other Services   PT consult     Precautions/Restrictions   Precautions Precautions: Fall Restrictions Weight Bearing Restrictions Per Provider Order: No     Mobility Bed Mobility               General bed mobility comments: in recliner upon arrival    Transfers                   General transfer comment: x2 attempts to stand from recliner, pt unable 2/2 nausea, requests  to return to sitting, per RN transferred to chair with 1 person assist earlier      Balance Overall balance assessment: Needs assistance Sitting-balance support: Feet supported Sitting balance-Leahy Scale: Good Sitting balance - Comments: sits unsupported in chair                                   ADL either performed or assessed with clinical judgement   ADL Overall ADL's : Needs assistance/impaired Eating/Feeding: Set up   Grooming: Set up   Upper Body Bathing: Minimal assistance   Lower Body Bathing: Moderate assistance   Upper Body Dressing : Minimal assistance   Lower Body Dressing: Moderate assistance   Toilet Transfer: Minimal assistance           Functional mobility during ADLs: Minimal assistance       Vision   Vision Assessment?: No apparent visual deficits     Perception Perception: Not tested       Praxis Praxis: Not tested       Pertinent Vitals/Pain Pain Assessment Pain Assessment: Faces Pain Score: 2  Faces Pain Scale: Hurts a little bit Pain Location: generalized Pain Descriptors / Indicators: Discomfort Pain Intervention(s): Limited activity within patient's tolerance, Monitored during session, Repositioned     Extremity/Trunk Assessment Upper Extremity Assessment Upper Extremity Assessment: Generalized weakness   Lower Extremity Assessment Lower Extremity Assessment: Defer to PT evaluation   Cervical / Trunk  Assessment Cervical / Trunk Assessment: Normal   Communication Communication Communication: No apparent difficulties   Cognition Arousal: Alert Behavior During Therapy: WFL for tasks assessed/performed, Flat affect Cognition: No apparent impairments                               Following commands: Intact       Cueing  General Comments   Cueing Techniques: Verbal cues  VSS on RA   Exercises     Shoulder Instructions      Home Living Family/patient expects to be discharged to::  Private residence Living Arrangements: Alone Available Help at Discharge: Family;Available PRN/intermittently (daughter lives in [place], son in [place]) Type of Home: House Home Access: Level entry     Home Layout: One level     Bathroom Shower/Tub: Walk-in shower         Home Equipment: Shower seat;Hand held shower head          Prior Functioning/Environment Prior Level of Function : Independent/Modified Independent;Driving             Mobility Comments: ind ADLs Comments: ind    OT Problem List: Decreased strength;Decreased activity tolerance;Decreased range of motion;Impaired balance (sitting and/or standing);Decreased safety awareness   OT Treatment/Interventions: Self-care/ADL training;Therapeutic exercise;Energy conservation;DME and/or AE instruction;Therapeutic activities;Balance training;Patient/family education      OT Goals(Current goals can be found in the care plan section)   Acute Rehab OT Goals Patient Stated Goal: none stated OT Goal Formulation: With patient Time For Goal Achievement: 10/14/24 Potential to Achieve Goals: Good   OT Frequency:  Min 2X/week    Co-evaluation              AM-PAC OT 6 Clicks Daily Activity     Outcome Measure Help from another person eating meals?: A Little Help from another person taking care of personal grooming?: A Little Help from another person toileting, which includes using toliet, bedpan, or urinal?: A Little Help from another person bathing (including washing, rinsing, drying)?: A Lot Help from another person to put on and taking off regular upper body clothing?: A Little Help from another person to put on and taking off regular lower body clothing?: A Lot 6 Click Score: 16   End of Session Equipment Utilized During Treatment: Rolling walker (2 wheels) Nurse Communication: Mobility status (pt requesting nausea meds)  Activity Tolerance: Patient tolerated treatment well Patient left: in  chair;with call bell/phone within reach  OT Visit Diagnosis: Unsteadiness on feet (R26.81);Other abnormalities of gait and mobility (R26.89);Muscle weakness (generalized) (M62.81);History of falling (Z91.81)                Time: 9258-9196 OT Time Calculation (min): 22 min Charges:  OT General Charges $OT Visit: 1 Visit OT Evaluation $OT Eval Low Complexity: 1 Low  Milani Lowenstein K, OTD, OTR/L SecureChat Preferred Acute Rehab [phone]   Nuel Dejaynes K Koonce 09/30/2024, 8:21 AM

## 2024-09-30 NOTE — Evaluation (Signed)
 Physical Therapy Evaluation Patient Details Name: Sarah Bird MRN: [id number] DOB: 09/29/1936 Today's Date: 09/30/2024  History of Present Illness  88 y/o F presenting to [facility] on 11/14 after fall, workup significant for posterior scalp soft tissue injury, splenic rupture with hemoperitoneum and acute hemorrhage in gastrosplenic ligament. 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 CT head, C spine, chest, abodome, pelvis negative.   PMH includes A fib on Eliquis, HTN, basal cell carcinoma, UTI  Clinical Impression  Pt admitted with above diagnosis. Previously independent, not using an assistive device to ambulate. Denies recent falls in the past 6 months prior to this one. She required min assist for transfers and gait with RW for support. VSS throughout. Patient will benefit from intensive inpatient follow-up therapy, >3 hours/day. Son present and supportive. Pt currently with functional limitations due to the deficits listed below (see PT Problem List). Pt will benefit from acute skilled PT to increase their independence and safety with mobility to allow discharge.           If plan is discharge home, recommend the following: A little help with walking and/or transfers;A little help with bathing/dressing/bathroom;Assistance with cooking/housework;Assist for transportation   Can travel by private vehicle        Equipment Recommendations Rolling walker (2 wheels)  Recommendations for Other Services  Rehab consult    Functional Status Assessment Patient has had a recent decline in their functional status and demonstrates the ability to make significant improvements in function in a reasonable and predictable amount of time.     Precautions / Restrictions Precautions Precautions: Fall Restrictions Weight Bearing Restrictions Per Provider Order: No      Mobility  Bed Mobility               General bed mobility comments: in recliner    Transfers Overall transfer  level: Needs assistance Equipment used: Rolling walker (2 wheels) Transfers: Sit to/from Stand Sit to Stand: Min assist           General transfer comment: Min assist for boost to stand from recliner and toilet. Cues for scooting to edge of chair, requires notable effort. RW to steady upon standing.    Ambulation/Gait Ambulation/Gait assistance: Min assist Gait Distance (Feet): 30 Feet (+15) Assistive device: Rolling walker (2 wheels) Gait Pattern/deviations: Step-through pattern, Decreased stride length, Trunk flexed Gait velocity: dec Gait velocity interpretation: <1.31 ft/sec, indicative of household ambulator   General Gait Details: Cues for upright stance, RW placement close to proximity to increase support as needed. Slow and weak with reduced step length but no overt buckling. Pt declines ability to progress further distances at this time. VSS throughout on RA.  Stairs            Wheelchair Mobility     Tilt Bed    Modified Rankin (Stroke Patients Only)       Balance Overall balance assessment: Needs assistance Sitting-balance support: Feet supported Sitting balance-Leahy Scale: Good Sitting balance - Comments: sits unsupported in chair   Standing balance support: During functional activity, Reliant on assistive device for balance Standing balance-Leahy Scale: Poor Standing balance comment: reliant on external support                             Pertinent Vitals/Pain Pain Assessment Pain Assessment: Faces Faces Pain Scale: Hurts a little bit Pain Location: LUE and abdomen Pain Descriptors / Indicators: Discomfort Pain Intervention(s): Limited activity within patient's  tolerance, Monitored during session, Repositioned    Home Living Family/patient expects to be discharged to:: Private residence Living Arrangements: Alone Available Help at Discharge: Family;Available PRN/intermittently (daughter lives in [place], son in [place]) Type of  Home: House Home Access: Level entry       Home Layout: One level Home Equipment: Shower seat;Hand held shower head      Prior Function Prior Level of Function : Independent/Modified Independent;Driving             Mobility Comments: States he furniture surfs around home. Nursing report suggests multiple falls at home, however pt and son denies any recent falls prior to this. ADLs Comments: ind     Extremity/Trunk Assessment   Upper Extremity Assessment Upper Extremity Assessment: Defer to OT evaluation    Lower Extremity Assessment Lower Extremity Assessment: Generalized weakness    Cervical / Trunk Assessment Cervical / Trunk Assessment: Normal  Communication   Communication Communication: No apparent difficulties    Cognition Arousal: Alert Behavior During Therapy: WFL for tasks assessed/performed, Flat affect   PT - Cognitive impairments: No apparent impairments                         Following commands: Intact       Cueing Cueing Techniques: Verbal cues     General Comments General comments (skin integrity, edema, etc.): SpO2 95%, HR 100, BP 145/85    Exercises General Exercises - Lower Extremity Ankle Circles/Pumps: AROM, Both, 10 reps, Seated Quad Sets: Strengthening, Both, 10 reps, Seated Gluteal Sets: Strengthening, Both, 10 reps, Seated   Assessment/Plan    PT Assessment Patient needs continued PT services  PT Problem List Decreased strength;Decreased range of motion;Decreased activity tolerance;Decreased balance;Decreased mobility;Decreased knowledge of use of DME;Pain       PT Treatment Interventions DME instruction;Gait training;Functional mobility training;Therapeutic activities;Therapeutic exercise;Balance training;Neuromuscular re-education;Patient/family education;Modalities    PT Goals (Current goals can be found in the Care Plan section)  Acute Rehab PT Goals Patient Stated Goal: Get well return home PT Goal  Formulation: With patient Time For Goal Achievement: 10/15/24 Potential to Achieve Goals: Good    Frequency Min 2X/week     Co-evaluation               AM-PAC PT 6 Clicks Mobility  Outcome Measure Help needed turning from your back to your side while in a flat bed without using bedrails?: A Little Help needed moving from lying on your back to sitting on the side of a flat bed without using bedrails?: A Little Help needed moving to and from a bed to a chair (including a wheelchair)?: A Little Help needed standing up from a chair using your arms (e.g., wheelchair or bedside chair)?: A Little Help needed to walk in hospital room?: A Little Help needed climbing 3-5 steps with a railing? : A Lot 6 Click Score: 17    End of Session   Activity Tolerance: Patient tolerated treatment well;Patient limited by fatigue Patient left: in chair;with call bell/phone within reach;with family/visitor present   PT Visit Diagnosis: Unsteadiness on feet (R26.81);Other abnormalities of gait and mobility (R26.89);Muscle weakness (generalized) (M62.81);History of falling (Z91.81);Difficulty in walking, not elsewhere classified (R26.2);Pain Pain - part of body:  (surgical incision site)    Time: 8578-8553 PT Time Calculation (min) (ACUTE ONLY): 25 min   Charges:   PT Evaluation $PT Eval Moderate Complexity: 1 Mod PT Treatments $Therapeutic Activity: 8-22 mins PT General Charges $$ ACUTE PT  VISIT: 1 Visit         Leontine Roads, PT, DPT [facility]  Rehabilitation Services Physical Therapist Office: [phone] Website: [email or web address]   Leontine GORMAN Roads 09/30/2024, 3:09 PM

## 2024-09-30 NOTE — TOC CM/SW Note (Signed)
 Transition of Care [facility]) - Inpatient Brief Assessment   Patient Details  Name: Sarah Bird MRN: [id number] Date of Birth: 1936-04-10  Transition of Care [facility] CM/SW Contact:    Josepha Mliss HERO, RN Phone Number: 09/30/2024, 5:23 PM   Clinical Narrative: 88 y/o F presenting to [facility] on 11/14 after fall, workup significant for posterior scalp soft tissue injury, splenic rupture with hemoperitoneum and acute hemorrhage in gastrosplenic ligament. 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 CT head, C spine, chest, abodome, pelvis negative.  Rehab consult requested.  Inpatient Care Management will follow as patient progresses.    Transition of Care Asessment: Insurance and Status: Insurance coverage has been reviewed Patient has primary care physician: Yes Emaline Piety) Home environment has been reviewed: Lives alone Prior level of function:: Lives alone; son and daughter nearby who provide intermittent assist Prior/Current Home Services: No current home services Social Drivers of Health Review: SDOH reviewed no interventions necessary Readmission risk has been reviewed: Yes Transition of care needs: no transition of care needs at this time]  Mliss MICAEL Josepha, RN, BSN  Trauma/Neuro ICU Case Manager [phone]

## 2024-09-30 NOTE — Progress Notes (Signed)
 Occupational Therapy Treatment Patient Details Name: Sarah Bird MRN: [id number] DOB: [birth year] Today's Date: 09/30/2024   History of present illness 88 y/o F presenting to [facility] on 11/14 after fall, workup significant for posterior scalp soft tissue injury, splenic rupture with hemoperitoneum and acute hemorrhage in gastrosplenic ligament. 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 CT head, C spine, chest, abodome, pelvis negative.   PMH includes A fib on Eliquis, HTN, basal cell carcinoma, UTI   OT comments  Pt seen for second session, able to progress and ambulate to bathroom and room distance with use of RW and minA. Pt needs up to min A for toileting and UB ADL, min A for bed mobility and min A for transfers with RW. Pt with slow and guarded gait, overall feels her legs are very weak. Pt educated on use of pillow to brace abdomen with bed mobility and standing as needed, pt verbalizes understanding. Pt presenting with impairments listed below, will follow acutely. Patient will benefit from intensive inpatient follow-up therapy, >3 hours/day.       If plan is discharge home, recommend the following:  A little help with walking and/or transfers;A lot of help with bathing/dressing/bathroom;Assistance with cooking/housework;Assist for transportation;Help with stairs or ramp for entrance   Equipment Recommendations  Other (comment);BSC/3in1 (RW)    Recommendations for Other Services PT consult;Rehab consult    Precautions / Restrictions Precautions Precautions: Fall Restrictions Weight Bearing Restrictions Per Provider Order: No       Mobility Bed Mobility Overal bed mobility: Needs Assistance Bed Mobility: Sit to Sidelying, Rolling Rolling: Supervision       Sit to sidelying: Min assist General bed mobility comments: min A to assist legs back into bed    Transfers Overall transfer level: Needs assistance Equipment used: Rolling walker (2 wheels) Transfers: Sit  to/from Stand Sit to Stand: Min assist           General transfer comment: from chair surface     Balance Overall balance assessment: Needs assistance Sitting-balance support: Feet supported Sitting balance-Leahy Scale: Good Sitting balance - Comments: sits unsupported in chair   Standing balance support: During functional activity, Reliant on assistive device for balance Standing balance-Leahy Scale: Poor Standing balance comment: reliant on external support                           ADL either performed or assessed with clinical judgement   ADL Overall ADL's : Needs assistance/impaired                 Upper Body Dressing : Minimal assistance Upper Body Dressing Details (indicate cue type and reason): donning gown on backside     Toilet Transfer: Minimal assistance;Ambulation;Rolling walker (2 wheels);Regular Toilet   Toileting- Clothing Manipulation and Hygiene: Contact guard assist Toileting - Clothing Manipulation Details (indicate cue type and reason): standing pericare     Functional mobility during ADLs: Minimal assistance;Rolling walker (2 wheels)      Extremity/Trunk Assessment Upper Extremity Assessment Upper Extremity Assessment: Generalized weakness;LUE deficits/detail LUE Deficits / Details: mild edema but functional for BADL/mobility tasks   Lower Extremity Assessment Lower Extremity Assessment: Defer to PT evaluation        Vision   Vision Assessment?: No apparent visual deficits   Perception Perception Perception: Not tested   Praxis Praxis Praxis: Not tested   Communication Communication Communication: No apparent difficulties   Cognition Arousal: Alert Behavior During Therapy: [facility] for tasks  assessed/performed, Flat affect Cognition: No apparent impairments                               Following commands: Intact        Cueing   Cueing Techniques: Verbal cues  Exercises      Shoulder Instructions        General Comments 88HR, 94% SpO2 and BP 146/109 (119) at end of session    Pertinent Vitals/ Pain       Pain Assessment Pain Assessment: Faces Pain Score: 2  Faces Pain Scale: Hurts a little bit Pain Location: LUE and abdomen Pain Descriptors / Indicators: Discomfort Pain Intervention(s): Limited activity within patient's tolerance, Monitored during session, Repositioned  Home Living                                          Prior Functioning/Environment              Frequency  Min 2X/week        Progress Toward Goals  OT Goals(current goals can now be found in the care plan section)  Progress towards OT goals: Progressing toward goals  Acute Rehab OT Goals Patient Stated Goal: to rest OT Goal Formulation: With patient Time For Goal Achievement: 10/14/24 Potential to Achieve Goals: Good ADL Goals Pt Will Perform Grooming: with supervision;sitting Pt Will Perform Upper Body Dressing: with supervision;sitting;standing Pt Will Perform Lower Body Dressing: with supervision;sitting/lateral leans;sit to/from stand Pt Will Transfer to Toilet: with supervision;ambulating;regular height toilet Pt Will Perform Tub/Shower Transfer: Shower transfer;with supervision;ambulating;shower seat  Plan      Co-evaluation                 AM-PAC OT 6 Clicks Daily Activity     Outcome Measure   Help from another person eating meals?: A Little Help from another person taking care of personal grooming?: A Little Help from another person toileting, which includes using toliet, bedpan, or urinal?: A Little Help from another person bathing (including washing, rinsing, drying)?: A Lot Help from another person to put on and taking off regular upper body clothing?: A Little Help from another person to put on and taking off regular lower body clothing?: A Lot 6 Click Score: 16    End of Session Equipment Utilized During Treatment: Rolling walker (2  wheels)  OT Visit Diagnosis: Unsteadiness on feet (R26.81);Other abnormalities of gait and mobility (R26.89);Muscle weakness (generalized) (M62.81);History of falling (Z91.81)   Activity Tolerance Patient tolerated treatment well   Patient Left in bed;with bed alarm set;with call bell/phone within reach;with family/visitor present   Nurse Communication Mobility status        Time: 1100-1125 OT Time Calculation (min): 25 min  Charges: OT General Charges $OT Visit: 1 Visit OT Treatments $Self Care/Home Management : 8-22 mins $Therapeutic Activity: 8-22 mins  Adir Schicker K, OTD, OTR/L SecureChat Preferred Acute Rehab [phone]   Rosario Duey K Koonce 09/30/2024, 11:40 AM

## 2024-09-30 NOTE — Progress Notes (Signed)
 Patient ID: Sarah Bird, female   DOB: 1935-11-22, 88 y.o.   MRN: [id number] Follow up - Trauma Critical Care   Patient Details:    Sarah Bird is an 88 y.o. female.  Lines/tubes : External Urinary Catheter (Active)  Dedicated Suction Verified suction is between 40-80 mmHg 09/30/24 0220  Securement Method Mesh underwear 09/30/24 0220  Site Assessment Clean, Dry, Intact 09/30/24 0220  Output (mL) 300 mL 09/30/24 0220    Microbiology/Sepsis markers: Results for orders placed or performed during the hospital encounter of 09/27/24  MRSA Next Gen by PCR, Nasal     Status: None   Collection Time: 09/27/24  6:58 PM   Specimen: Nasal Mucosa; Nasal Swab  Result Value Ref Range Status   MRSA by PCR Next Gen NOT DETECTED NOT DETECTED Final    Comment: (NOTE) The GeneXpert MRSA Assay (FDA approved for NASAL specimens only), is one component of a comprehensive MRSA colonization surveillance program. It is not intended to diagnose MRSA infection nor to guide or monitor treatment for MRSA infections. Test performance is not FDA approved in patients less than 79 years old. Performed at [facility], [address], [place], [place] [address]     Anti-infectives:  Anti-infectives (From admission, onward)    Start     Dose/Rate Route Frequency Ordered Stop   09/27/24 1645  ceFAZolin (ANCEF) IVPB 1 g/50 mL premix        1 g 100 mL/hr over 30 Minutes Intravenous  Once 09/27/24 1641 09/27/24 1715      Subjective:    Overnight Issues:  Up in chair, flatus overnight no nausea Objective:  Vital signs for last 24 hours: Temp:  [97.5 F (36.4 C)-98 F (36.7 C)] 98 F (36.7 C) (11/17 0000) Pulse Rate:  [89-116] 92 (11/17 0200) Resp:  [12-26] 15 (11/17 0200) BP: (138-194)/(73-121) 155/89 (11/17 0200) SpO2:  [92 %-98 %] 96 % (11/17 0200) Arterial Line BP: (151-167)/(79-94) 167/94 (11/16 0900)  Hemodynamic parameters for last 24 hours:    Intake/Output from previous  day: 11/16 0701 - 11/17 0700 In: 1900.6 [I.V.:1900.6] Out: 3175 [Urine:3175]  Intake/Output this shift: No intake/output data recorded.  Vent settings for last 24 hours:    Physical Exam:  General: alert and no respiratory distress Resp: clear to auscultation bilaterally CVS: irreg irreg 100 GI: soft, dressing OK, ND  No results found for this or any previous visit (from the past 24 hours).  Assessment & Plan: Present on Admission: **None**    LOS: 3 days   Additional comments:I reviewed the patient's new clinical lab test results. / 88 y/o F s/p GLF on Eliquis  Ruptured spleen with 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 Sebastian, will need splenectomy vaccines prior to discharge ABL anemia - due to above;  CBC P this AM but Hb has been stable Posterior scalp hematoma  Rhabdomyolysis - CK downtrending, continue bicarb for now pending AM labs not back yet AKI - Improving, Cr 0.6 yesterday, labs P Elevated troponin - trended back down, suspect demand ischemia, continue cardiac monitoring; EKG with afib without ST changes; Acute post-operative VDRF - extubated 11/15 and doing well Hypokalemia - replete   HTN - home meds resumed, trend Afib - hold DOAC; resume home cardizem   FEN - CLD, may D/C bicarb IVF P labs VTE - SCD's, hold chemical VTE in the setting of ABL anemia ID - got ancef periop Foley - TOV today    Dispo: Transfer  to PCU. Therapies. May need some form of rehab before going home and she is agreeable. Critical Care Total Time*: 33 Minutes  Dann Hummer, MD, MPH, FACS Trauma & General Surgery Use [email or web address] to contact on call provider  09/30/2024  *Care during the described time interval was provided by me. I have reviewed this patient's available data, including medical history, events of note, physical examination and test results as part of my evaluation.

## 2024-09-30 NOTE — Progress Notes (Signed)

## 2024-10-01 DIAGNOSIS — T796XXD Traumatic ischemia of muscle, subsequent encounter: Secondary | ICD-10-CM

## 2024-10-01 DIAGNOSIS — Z9081 Acquired absence of spleen: Secondary | ICD-10-CM

## 2024-10-01 LAB — SURGICAL PATHOLOGY

## 2024-10-01 LAB — CBC
HCT: 32.2 % — ABNORMAL LOW (ref 36.0–46.0)
Hemoglobin: 10.8 g/dL — ABNORMAL LOW (ref 12.0–15.0)
MCH: 28.5 pg (ref 26.0–34.0)
MCHC: 33.5 g/dL (ref 30.0–36.0)
MCV: 85 fL (ref 80.0–100.0)
Platelets: 203 K/uL (ref 150–400)
RBC: 3.79 MIL/uL — ABNORMAL LOW (ref 3.87–5.11)
RDW: 15.8 % — ABNORMAL HIGH (ref 11.5–15.5)
WBC: 11.9 K/uL — ABNORMAL HIGH (ref 4.0–10.5)
nRBC: 1.7 % — ABNORMAL HIGH (ref 0.0–0.2)

## 2024-10-01 LAB — BASIC METABOLIC PANEL WITH GFR
Anion gap: 11 (ref 5–15)
BUN: 6 mg/dL — ABNORMAL LOW (ref 8–23)
CO2: 24 mmol/L (ref 22–32)
Calcium: 8 mg/dL — ABNORMAL LOW (ref 8.9–10.3)
Chloride: 96 mmol/L — ABNORMAL LOW (ref 98–111)
Creatinine, Ser: 0.72 mg/dL (ref 0.44–1.00)
GFR, Estimated: >60 mL/min (ref >60)
Glucose, Bld: 83 mg/dL (ref 70–99)
Potassium: 3.3 mmol/L — ABNORMAL LOW (ref 3.5–5.1)
Sodium: 131 mmol/L — ABNORMAL LOW (ref 135–145)

## 2024-10-01 MED ORDER — METHOCARBAMOL 750 MG PO TABS
750.0000 mg | ORAL_TABLET | Freq: Three times a day (TID) | ORAL | Status: DC
  Administered 2024-10-01 – 2024-10-09 (×25): 750 mg via ORAL
  Filled 2024-10-01 (×8): qty 1
  Filled 2024-10-01: qty 2
  Filled 2024-10-01 (×2): qty 1
  Filled 2024-10-01: qty 2
  Filled 2024-10-01 (×13): qty 1

## 2024-10-01 MED ORDER — MAGNESIUM SULFATE 2 GM/50ML IV SOLN
2.0000 g | Freq: Once | INTRAVENOUS | Status: AC
  Administered 2024-10-01: 2 g via INTRAVENOUS
  Filled 2024-10-01: qty 50

## 2024-10-01 MED ORDER — TRAMADOL HCL 50 MG PO TABS
50.0000 mg | ORAL_TABLET | Freq: Four times a day (QID) | ORAL | Status: DC | PRN
  Administered 2024-10-01 – 2024-10-02 (×4): 50 mg via ORAL
  Filled 2024-10-01 (×5): qty 1

## 2024-10-01 MED ORDER — TAMSULOSIN HCL 0.4 MG PO CAPS
0.4000 mg | ORAL_CAPSULE | Freq: Every day | ORAL | Status: DC
  Administered 2024-10-01 – 2024-10-07 (×7): 0.4 mg via ORAL
  Filled 2024-10-01 (×7): qty 1

## 2024-10-01 MED ORDER — POTASSIUM CHLORIDE 20 MEQ PO PACK
40.0000 meq | PACK | Freq: Once | ORAL | Status: AC
  Administered 2024-10-01: 40 meq via ORAL
  Filled 2024-10-01: qty 2

## 2024-10-01 NOTE — Consult Note (Signed)
 Physical Medicine and Rehabilitation Consult Reason for Consult: Impaired functional mobility Referring Physician: Trauma service   HPI: Sarah Bird is a 88 y.o. female with a history of atrial fibrillation, hypertension, basal cell carcinoma who was admitted to  regional initially on 09/27/2024 after a fall the previous day.  She was found by her son the following morning.  Patient suffered a soft tissue injury to the scalp, splenic rupture with hemoperitoneum and acute hemorrhage in the gastrosplenic ligament.  Patient underwent an open splenectomy at [facility] on the same day.  Hospital course complicated by anemia due to the above, rhabdomyolysis due to being on the floor for prolonged period of time, acute postoperative VDRF (patient extubated 09/28/2024), urine retention and pain.  Patient has been up with physical therapy and yesterday was min assist for sit to stand transfers and gait 30 feet using a rolling walker.  Patient lives alone and has family who can assist intermittently.  She has 1 level house with level entry.    Home: Home Living Family/patient expects to be discharged to:: Private residence Living Arrangements: Alone Available Help at Discharge: Family, Available PRN/intermittently (daughter lives in [place], son in [place]) Type of Home: House Home Access: Level entry Home Layout: One level Bathroom Shower/Tub: Walk-in shower Home Equipment: Information systems manager, Hand held shower head  Functional History: Prior Function Prior Level of Function : Independent/Modified Independent, Driving Mobility Comments: States he furniture surfs around home. Nursing report suggests multiple falls at home, however pt and son denies any recent falls prior to this. ADLs Comments: ind Functional Status:  Mobility: Bed Mobility Overal bed mobility: Needs Assistance Bed Mobility: Sit to Sidelying, Rolling Rolling: Supervision Sit to sidelying: Min  assist General bed mobility comments: in recliner Transfers Overall transfer level: Needs assistance Equipment used: Rolling walker (2 wheels) Transfers: Sit to/from Stand Sit to Stand: Min assist General transfer comment: Min assist for boost to stand from recliner and toilet. Cues for scooting to edge of chair, requires notable effort. RW to steady upon standing. Ambulation/Gait Ambulation/Gait assistance: Min assist Gait Distance (Feet): 30 Feet (+15) Assistive device: Rolling walker (2 wheels) Gait Pattern/deviations: Step-through pattern, Decreased stride length, Trunk flexed General Gait Details: Cues for upright stance, RW placement close to proximity to increase support as needed. Slow and weak with reduced step length but no overt buckling. Pt declines ability to progress further distances at this time. VSS throughout on RA. Gait velocity: dec Gait velocity interpretation: <1.31 ft/sec, indicative of household ambulator    ADL: ADL Overall ADL's : Needs assistance/impaired Eating/Feeding: Set up Grooming: Set up Upper Body Bathing: Minimal assistance Lower Body Bathing: Moderate assistance Upper Body Dressing : Minimal assistance Upper Body Dressing Details (indicate cue type and reason): donning gown on backside Lower Body Dressing: Moderate assistance Toilet Transfer: Minimal assistance, Ambulation, Rolling walker (2 wheels), Regular Toilet Toileting- Clothing Manipulation and Hygiene: Contact guard assist Toileting - Clothing Manipulation Details (indicate cue type and reason): standing pericare Functional mobility during ADLs: Minimal assistance, Rolling walker (2 wheels)  Cognition: Cognition Orientation Level: Oriented X4 Cognition Arousal: Alert Behavior During Therapy: WFL for tasks assessed/performed, Flat affect   Review of Systems  Constitutional: Negative.   HENT: Negative.    Eyes: Negative.   Respiratory: Negative.    Cardiovascular: Negative.    Gastrointestinal: Negative.   Genitourinary:  Positive for flank pain.  Musculoskeletal:  Positive for falls and myalgias.  Skin: Negative.   Neurological:  Positive  for weakness.   Past Medical History:  Diagnosis Date   Atrial fibrillation (HCC)    Cancer [facility]) 1966   Cervical   Hypertension    Past Surgical History:  Procedure Laterality Date   ABDOMINAL HYSTERECTOMY  1966   CERVICAL CONIZATION W/BX     COLONOSCOPY  09/2007   Dr Dessa   COLONOSCOPY WITH PROPOFOL  N/A 09/29/2015   Procedure: COLONOSCOPY WITH PROPOFOL ;  Surgeon: Reyes LELON Dessa, MD;  Location: [facility] ENDOSCOPY;  Service: Endoscopy;  Laterality: N/A;   EYE SURGERY Bilateral 4 years ago   cataracts   SPLENECTOMY, TOTAL N/A 09/27/2024   Procedure: SPLENECTOMY;  Surgeon: Sebastian Moles, MD;  Location: [facility] OR;  Service: General;  Laterality: N/A;   TONSILLECTOMY  1945   Family History  Problem Relation Age of Onset   Colon cancer Mother 77   Colon cancer Brother 46   Breast cancer Maternal Aunt        3 aunts age 31's   Breast cancer Maternal Grandmother        mat great gm   Social History:  reports that she has never smoked. She has never used smokeless tobacco. She reports current alcohol use of about 7.0 standard drinks of alcohol per week. She reports that she does not use drugs. Allergies:  Allergies  Allergen Reactions   Actonel [Risedronate] Other (See Comments)    Unknown reaction   Cozaar [Losartan] Other (See Comments)    Unknown reaction   Sulfa Antibiotics Rash   Medications Prior to Admission  Medication Sig Dispense Refill   acetaminophen (TYLENOL) 500 MG tablet Take 1,000 mg by mouth 2 (two) times daily as needed for fever or headache (pain).     azelastine (ASTELIN) 0.1 % nasal spray Place 1-2 sprays into both nostrils 2 (two) times daily as needed for allergies or rhinitis.     Cholecalciferol (VITAMIN D-3 PO) Take 1 capsule by mouth daily.     Cyanocobalamin (VITAMIN B-12 PO) Take  1 tablet by mouth daily.     diltiazem (CARDIZEM CD) 120 MG 24 hr capsule Take 120 mg by mouth daily.     diphenhydramine-acetaminophen (TYLENOL PM) 25-500 MG TABS tablet Take 1 tablet by mouth at bedtime as needed (sleep, pain).     ELIQUIS 5 MG TABS tablet Take 5 mg by mouth 2 (two) times daily.     furosemide (LASIX) 20 MG tablet Take 20 mg by mouth daily.     potassium chloride (KLOR-CON) 10 MEQ tablet Take 10 mEq by mouth daily.     [EXPIRED] ciprofloxacin (CIPRO) 500 MG tablet Take 1 tablet (500 mg total) by mouth 2 (two) times daily for 7 days. (Patient not taking: Reported on 09/28/2024) 14 tablet 0     Blood pressure 131/71, pulse 75, temperature (!) 97 F (36.1 C), temperature source Axillary, resp. rate 13, SpO2 96%. Physical Exam Constitutional:      General: She is not in acute distress.    Appearance: She is obese.  HENT:     Head: Normocephalic.     Right Ear: External ear normal.     Left Ear: External ear normal.     Nose: Nose normal.  Eyes:     Conjunctiva/sclera: Conjunctivae normal.     Pupils: Pupils are equal, round, and reactive to light.  Cardiovascular:     Rate and Rhythm: Normal rate and regular rhythm.  Pulmonary:     Effort: Pulmonary effort is normal. No respiratory distress.  Abdominal:  General: Bowel sounds are normal.     Tenderness: There is abdominal tenderness.  Musculoskeletal:        General: Tenderness (RLE) present.     Cervical back: Normal range of motion.     Right lower leg: Edema present.     Left lower leg: Edema present.  Skin:    General: Skin is warm.     Comments: Abdominal wound. Abrasion right face   Neurological:     Mental Status: She is alert.     Comments: Alert and oriented x 3. Normal insight and awareness. Intact Memory. Normal language and speech. Cranial nerve exam unremarkable. MMT: RUE 4/5. LUE 4- to 4/5. RLE 2/5 HF, 3/5 KE and 4/5 ADF/PF. LLE 35 prox to 4/5 distally. Sensory exam normal for light touch and  pain in all 4 limbs. No limb ataxia or cerebellar signs. No abnormal tone appreciated.  Sarah Bird    Psychiatric:        Mood and Affect: Mood normal.        Behavior: Behavior normal.     Results for orders placed or performed during the hospital encounter of 09/27/24 (from the past 24 hours)  Basic metabolic panel     Status: Abnormal   Collection Time: 10/01/24  4:05 AM  Result Value Ref Range   Sodium 131 (L) 135 - 145 mmol/L   Potassium 3.3 (L) 3.5 - 5.1 mmol/L   Chloride 96 (L) 98 - 111 mmol/L   CO2 24 22 - 32 mmol/L   Glucose, Bld 83 70 - 99 mg/dL   BUN 6 (L) 8 - 23 mg/dL   Creatinine, Ser 9.27 0.44 - 1.00 mg/dL   Calcium 8.0 (L) 8.9 - 10.3 mg/dL   GFR, Estimated >39 >39 mL/min   Anion gap 11 5 - 15  CBC     Status: Abnormal   Collection Time: 10/01/24  4:05 AM  Result Value Ref Range   WBC 11.9 (H) 4.0 - 10.5 K/uL   RBC 3.79 (L) 3.87 - 5.11 MIL/uL   Hemoglobin 10.8 (L) 12.0 - 15.0 g/dL   HCT 67.7 (L) 63.9 - 53.9 %   MCV 85.0 80.0 - 100.0 fL   MCH 28.5 26.0 - 34.0 pg   MCHC 33.5 30.0 - 36.0 g/dL   RDW 84.1 (H) 88.4 - 84.4 %   Platelets 203 150 - 400 K/uL   nRBC 1.7 (H) 0.0 - 0.2 %   No results found.  Assessment/Plan: Diagnosis: 88 year old female with rhabdomyolysis after a fall with prolonged down period, where she also sustained a splenic rupture requiring splenectomy.  Patient lives alone Does the need for close, 24 hr/day medical supervision in concert with the patient's rehab needs make it unreasonable for this patient to be served in a less intensive setting? Yes Co-Morbidities requiring supervision/potential complications:  - Acute blood loss anemia -Atrial fibrillation -Urinary retention -Pain management Due to bladder management, bowel management, safety, skin/wound care, disease management, medication administration, pain management, and patient education, does the patient require 24 hr/day rehab nursing? Yes Does the patient require coordinated care of a  physician, rehab nurse, therapy disciplines of PT, OT to address physical and functional deficits in the context of the above medical diagnosis(es)? Yes Addressing deficits in the following areas: balance, endurance, locomotion, strength, transferring, bowel/bladder control, bathing, dressing, feeding, grooming, toileting, and psychosocial support Can the patient actively participate in an intensive therapy program of at least 3 hrs of therapy per day at least 5  days per week? Yes The potential for patient to make measurable gains while on inpatient rehab is excellent Anticipated functional outcomes upon discharge from inpatient rehab are modified independent and supervision  with PT, modified independent and supervision with OT, n/a with SLP. Estimated rehab length of stay to reach the above functional goals is: 7-10 days Anticipated discharge destination: Home Overall Rehab/Functional Prognosis: excellent  POST ACUTE RECOMMENDATIONS: This patient's condition is appropriate for continued rehabilitative care in the following setting: CIR Patient has agreed to participate in recommended program. Yes Note that insurance prior authorization may be required for reimbursement for recommended care.  Comment: Pt was active and independent prior to fall, injuries. Rehab Admissions Coordinator to follow up.       I have personally performed a face to face diagnostic evaluation of this patient. Additionally, I have examined the patient's medical record including any pertinent labs and radiographic images.    Thanks,  Arthea ONEIDA Gunther, MD 10/01/2024

## 2024-10-01 NOTE — PMR Pre-admission (Shared)
 PMR Admission Coordinator Pre-Admission Assessment  Patient: Sarah Bird is an 88 y.o., female MRN: [id number] DOB: March 04, 1936 Height:   Weight:    Insurance Information HMO: ***    PPO: ***     PCP:      IPA:      80/20:      OTHER:  PRIMARY: [employer]      Policy#: ***      Subscriber: *** CM Name: ***      Phone#: ***     Fax#: *** Pre-Cert#: ***      Employer: *** Benefits:  Phone #: ***     Name: *** Eustacio. Date: ***     Deduct: ***      Out of Pocket Max: ***      Life Max: *** CIR: ***      SNF: *** Outpatient: ***     Co-Pay: *** Home Health: ***      Co-Pay: *** DME: ***     Co-Pay: *** Providers: *** SECONDARY: ***      Policy#: ***     Phone#: ***  Financial Counselor: ***      Phone#: ***  The "Data Collection Information Summary" for patients in Inpatient Rehabilitation Facilities with attached "Privacy Act Statement-Health Care Records" was provided and verbally reviewed with: Patient and Family  Emergency Contact Information Contact Information     Name Relation Home Work Mobile   [place] Son   [phone]   Chanique, Duca   [phone]   Rubye Browning Daughter   [phone]   Jake, Goodson Relative   [phone]      Other Contacts   None on File     Current Medical History  Patient Admitting Diagnosis: rhabdo, ruptured spleen requiring splenectomy  History of Present Illness: XITLALLY MOONEYHAM is a 88 y.o. female with a history of atrial fibrillation, hypertension, basal cell carcinoma who was admitted to [facility] initially on 09/27/2024 after a fall the previous day.  She was found by her son the following morning.  Patient suffered a soft tissue injury to the scalp, splenic rupture with hemoperitoneum and acute hemorrhage in the gastrosplenic ligament.  Patient underwent an open splenectomy at [facility] on the same day.  Hospital course complicated by anemia due to the above, rhabdomyolysis due to being on the floor  for prolonged period of time, acute postoperative VDRF (patient extubated 09/28/2024), urine retention and pain.  Therapy ongoing and recommendations are for CIR.     Patient's medical record from [facility] has been reviewed by the rehabilitation admission coordinator and physician.  Past Medical History  Past Medical History:  Diagnosis Date   Atrial fibrillation (HCC)    Cancer (HCC) 1966   Cervical   Hypertension     Has the patient had major surgery during 100 days prior to admission? Yes  Family History   family history includes Breast cancer in her maternal aunt and maternal grandmother; Colon cancer (age of onset: 45) in her brother; Colon cancer (age of onset: 78) in her mother.  Current Medications  Current Facility-Administered Medications:    0.9 %  sodium chloride  infusion, 10 mL/hr, Intravenous, Once, [employer], PA-C   acetaminophen (TYLENOL) tablet 1,000 mg, 1,000 mg, Oral, Q6H, Johnson, Kelly R, PA-C, 1,000 mg at 10/01/24 0900   Chlorhexidine Gluconate Cloth 2 % PADS 6 each, 6 each, Topical, Daily, Johnson, Kelly R, PA-C, 6 each at 10/01/24 0909   diltiazem (CARDIZEM CD) 24 hr capsule  120 mg, 120 mg, Oral, Daily, Sebastian Moles, MD, 120 mg at 10/01/24 0901   furosemide (LASIX) tablet 20 mg, 20 mg, Oral, Daily, Sebastian Moles, MD, 20 mg at 10/01/24 0900   labetalol (NORMODYNE) injection 10 mg, 10 mg, Intravenous, Q2H PRN, Dasie Leonor CROME, MD, 10 mg at 09/30/24 0130   meclizine (ANTIVERT) tablet 12.5 mg, 12.5 mg, Oral, TID PRN, Johnson, Kelly R, PA-C, 12.5 mg at 09/29/24 1318   methocarbamol (ROBAXIN) tablet 750 mg, 750 mg, Oral, TID, Sebastian Moles, MD, 750 mg at 10/01/24 0900   morphine (PF) 2 MG/ML injection 2-4 mg, 2-4 mg, Intravenous, Q2H PRN, Johnson, Kelly R, PA-C, 4 mg at 10/01/24 1212   ondansetron (ZOFRAN-ODT) disintegrating tablet 4 mg, 4 mg, Oral, Q6H PRN **OR** ondansetron (ZOFRAN) injection 4 mg, 4 mg, Intravenous, Q6H PRN, Johnson, Kelly R, PA-C,  4 mg at 09/30/24 9188   Oral care mouth rinse, 15 mL, Mouth Rinse, PRN, Johnson, Kelly R, PA-C   potassium chloride (KLOR-CON) packet 40 mEq, 40 mEq, Oral, Once, Sebastian Moles, MD   tamsulosin [facility]) capsule 0.4 mg, 0.4 mg, Oral, Daily, Sebastian Moles, MD, 0.4 mg at 10/01/24 0900   traMADol (ULTRAM) tablet 50 mg, 50 mg, Oral, Q6H PRN, Sebastian Moles, MD, 50 mg at 10/01/24 0900  Patients Current Diet:  Diet Order             Diet regular Room service appropriate? Yes; Fluid consistency: Thin  Diet effective now                   Precautions / Restrictions Precautions Precautions: Fall Restrictions Weight Bearing Restrictions Per Provider Order: No   Has the patient had 2 or more falls or a fall with injury in the past year? Yes  Prior Activity Level Community (5-7x/wk): indepndent prior to admit, no DME, driving  Prior Functional Level Self Care: Did the patient need help bathing, dressing, using the toilet or eating? Independent  Indoor Mobility: Did the patient need assistance with walking from room to room (with or without device)? Independent  Stairs: Did the patient need assistance with internal or external stairs (with or without device)? Independent  Functional Cognition: Did the patient need help planning regular tasks such as shopping or remembering to take medications? Independent  Patient Information Are you of Hispanic, Latino/a,or Spanish origin?: A. No, not of Hispanic, Latino/a, or Spanish origin What is your race?: A. White Do you need or want an interpreter to communicate with a doctor or health care staff?: 0. No  Patient's Response To:  Health Literacy and Transportation Is the patient able to respond to health literacy and transportation needs?: Yes Health Literacy - How often do you need to have someone help you when you read instructions, pamphlets, or other written material from your doctor or pharmacy?: Never In the past 12 months, has lack  of transportation kept you from medical appointments or from getting medications?: No In the past 12 months, has lack of transportation kept you from meetings, work, or from getting things needed for daily living?: No  Journalist, Newspaper / Equipment Home Equipment: Shower seat, Hand held shower head  Prior Device Use: Indicate devices/aids used by the patient prior to current illness, exacerbation or injury? None of the above  Current Functional Level Cognition  Orientation Level: Oriented X4    Extremity Assessment (includes Sensation/Coordination)  Upper Extremity Assessment: Defer to OT evaluation LUE Deficits / Details: mild edema but functional for BADL/mobility tasks  Lower Extremity Assessment: Generalized weakness    ADLs  Overall ADL's : Needs assistance/impaired Eating/Feeding: Set up Grooming: Set up Upper Body Bathing: Minimal assistance Lower Body Bathing: Moderate assistance Upper Body Dressing : Minimal assistance Upper Body Dressing Details (indicate cue type and reason): donning gown on backside Lower Body Dressing: Moderate assistance Toilet Transfer: Minimal assistance, Ambulation, Rolling walker (2 wheels), Regular Toilet Toileting- Clothing Manipulation and Hygiene: Contact guard assist Toileting - Clothing Manipulation Details (indicate cue type and reason): standing pericare Functional mobility during ADLs: Minimal assistance, Rolling walker (2 wheels)    Mobility  Overal bed mobility: Needs Assistance Bed Mobility: Sit to Sidelying, Rolling Rolling: Supervision Sit to sidelying: Min assist General bed mobility comments: in recliner    Transfers  Overall transfer level: Needs assistance Equipment used: Rolling walker (2 wheels) Transfers: Sit to/from Stand Sit to Stand: Min assist General transfer comment: Min assist for boost to stand from recliner and toilet. Cues for scooting to edge of chair, requires notable effort. RW to steady upon  standing.    Ambulation / Gait / Stairs / Wheelchair Mobility  Ambulation/Gait Ambulation/Gait assistance: Editor, Commissioning (Feet): 30 Feet (+15) Assistive device: Rolling walker (2 wheels) Gait Pattern/deviations: Step-through pattern, Decreased stride length, Trunk flexed General Gait Details: Cues for upright stance, RW placement close to proximity to increase support as needed. Slow and weak with reduced step length but no overt buckling. Pt declines ability to progress further distances at this time. VSS throughout on RA. Gait velocity: dec Gait velocity interpretation: <1.31 ft/sec, indicative of household ambulator    Posture / Balance Dynamic Sitting Balance Sitting balance - Comments: sits unsupported in chair Balance Overall balance assessment: Needs assistance Sitting-balance support: Feet supported Sitting balance-Leahy Scale: Good Sitting balance - Comments: sits unsupported in chair Standing balance support: During functional activity, Reliant on assistive device for balance Standing balance-Leahy Scale: Poor Standing balance comment: reliant on external support    Special considerations/life events  Skin surgical incision to abdomen, vascular ulcers to BLEs   Previous Home Environment (from acute therapy documentation) Living Arrangements: Alone Available Help at Discharge: Family, Available PRN/intermittently (daughter lives in [place], son in [place]) Type of Home: House Home Layout: One level Home Access: Level entry Bathroom Shower/Tub: Walk-in shower Home Care Services: No  Discharge Living Setting Plans for Discharge Living Setting: Patient's home, Lives with (comment) (family to stay with her) Type of Home at Discharge: House Discharge Home Layout: One level Discharge Home Access: Level entry Discharge Bathroom Shower/Tub: Walk-in shower Discharge Bathroom Toilet: Standard Discharge Bathroom Accessibility: Yes How Accessible: Accessible via  walker Does the patient have any problems obtaining your medications?: No  Social/Family/Support Systems Anticipated Caregiver: children Anticipated Caregiver's Contact Information: Ozell  [phone] Ability/Limitations of Caregiver: none stated Caregiver Availability: 24/7 Discharge Plan Discussed with Primary Caregiver: Yes Is Caregiver In Agreement with Plan?: Yes Does Caregiver/Family have Issues with Lodging/Transportation while Pt is in Rehab?: No  Goals Patient/Family Goal for Rehab: PT/OT supervision to mod I, SLPn/a Expected length of stay: 10-12 days Additional Information: discharge plan: return to patient's home with family providing 24/7 supervision if needed Pt/Family Agrees to Admission and willing to participate: Yes Program Orientation Provided & Reviewed with Pt/Caregiver Including Roles  & Responsibilities: Yes  Decrease burden of Care through IP rehab admission: n/a  Possible need for SNF placement upon discharge: Not anticipated.  Plan for discharge home with 3 children providing support.   Patient Condition: I have reviewed medical records  from [facility], spoken with CM, and patient and son. I met with patient at the bedside for inpatient rehabilitation assessment.  Patient will benefit from ongoing PT and OT, can actively participate in 3 hours of therapy a day 5 days of the week, and can make measurable gains during the admission.  Patient will also benefit from the coordinated team approach during an Inpatient Acute Rehabilitation admission.  The patient will receive intensive therapy as well as Rehabilitation physician, nursing, social worker, and care management interventions.  Due to safety, skin/wound care, disease management, medication administration, pain management, and patient education the patient requires 24 hour a day rehabilitation nursing.  The patient is currently min assist to mod assist with mobility and basic ADLs.  Discharge setting and therapy  post discharge at home is anticipated.  Patient has agreed to participate in the Acute Inpatient Rehabilitation Program and will admit {Time; today/tomorrow:10263}.  Preadmission Screen Completed By:  Reche FORBES Lowers, 10/01/2024 2:40 PM ______________________________________________________________________   Discussed status with Dr. PIERRETTE on *** at *** and received approval for admission today.  Admission Coordinator:  Sharnese Heath E Ada Holness, PT, time PIERRETTEPattricia ***   Assessment/Plan: Diagnosis: *** Does the need for close, 24 hr/day Medical supervision in concert with the patient's rehab needs make it unreasonable for this patient to be served in a less intensive setting? {yes_no_potentially:3041433} Co-Morbidities requiring supervision/potential complications: *** Due to {due un:6958565}, does the patient require 24 hr/day rehab nursing? {yes_no_potentially:3041433} Does the patient require coordinated care of a physician, rehab nurse, PT, OT, and SLP to address physical and functional deficits in the context of the above medical diagnosis(es)? {yes_no_potentially:3041433} Addressing deficits in the following areas: {deficits:3041436} Can the patient actively participate in an intensive therapy program of at least 3 hrs of therapy 5 days a week? {yes_no_potentially:3041433} The potential for patient to make measurable gains while on inpatient rehab is {potential:3041437} Anticipated functional outcomes upon discharge from inpatient rehab: {functional outcomes:304600100} PT, {functional outcomes:304600100} OT, {functional outcomes:304600100} SLP Estimated rehab length of stay to reach the above functional goals is: *** Anticipated discharge destination: {anticipated dc setting:21604} 10. Overall Rehab/Functional Prognosis: {potential:3041437}   MD Signature: ***

## 2024-10-01 NOTE — Plan of Care (Signed)
 Progressing Add All Education: Knowledge of General Education information will improve Add Today at [phone] - Progressing by Rolfe Corean HERO, RN Add Health Behavior/Discharge Planning: Ability to manage health-related needs will improve Add Today at 0517 - Progressing by Rolfe Corean HERO, RN Add Clinical Measurements: Ability to maintain clinical measurements within normal limits will improve Add Today at 0517 - Progressing by Rolfe Corean HERO, RN Add Will remain free from infection Add Today at 0517 - Progressing by Rolfe Corean HERO, RN Add Diagnostic test results will improve Add Today at [phone] - Progressing by Rolfe Corean HERO, RN Add Respiratory complications will improve Add Today at [phone] - Progressing by Rolfe Corean HERO, RN Add Cardiovascular complication will be avoided Add Today at 0517 - Progressing by Rolfe Corean HERO, RN Add Activity: Risk for activity intolerance will decrease Add Today at 0517 - Progressing by Rolfe Corean HERO, RN Add Nutrition: Adequate nutrition will be maintained Add Today at 0517 - Progressing by Rolfe Corean HERO, RN Add Coping: Level of anxiety will decrease Add Today at 0517 - Progressing by Rolfe Corean HERO, RN Add Elimination: Will not experience complications related to bowel motility Add Today at 0517 - Progressing by Rolfe Corean HERO, RN Add Will not experience complications related to urinary retention Add Today at 0517 - Progressing by Rolfe Corean HERO, RN Add Pain Managment: General experience of comfort will improve and/or be controlled Add Today at 0517 - Progressing by Rolfe Corean HERO, RN Add Safety: Ability to remain free from injury will improve Add Today at 0517 - Progressing by Rolfe Corean HERO, RN Add Skin Integrity: Risk for impaired skin integrity will decrease Add Today at 0517 - Progressing by Rolfe Corean HERO, RN

## 2024-10-01 NOTE — Plan of Care (Signed)

## 2024-10-01 NOTE — Progress Notes (Signed)
 Patient ID: Sarah Bird, female   DOB: 1936-03-13, 88 y.o.   MRN: [id number] 4 Days Post-Op    Subjective: Urinary retention Dizzy after oxy Tolerated clears OK but broth made her nauseated ROS negative except as listed above. Objective: Vital signs in last 24 hours: Temp:  [98.1 F (36.7 C)-98.9 F (37.2 C)] 98.2 F (36.8 C) (11/18 0600) Pulse Rate:  [81-103] 86 (11/18 0400) Resp:  [11-20] 13 (11/18 0400) BP: (113-157)/(70-119) 133/70 (11/18 0400) SpO2:  [91 %-96 %] 92 % (11/18 0400) Last BM Date :  (PTA)  Intake/Output from previous day: 11/17 0701 - 11/18 0700 In: 736.1 [P.O.:500; I.V.:190.9; IV Piggyback:45.3] Out: 1500 [Urine:1500] Intake/Output this shift: No intake/output data recorded.  General appearance: alert and cooperative Resp: clear to auscultation bilaterally GI: soft, dressing CDI, ND Extremities: calves soft  Lab Results: CBC  Recent Labs    09/30/24 0749 10/01/24 0405  WBC 11.5* 11.9*  HGB 11.5* 10.8*  HCT 33.8* 32.2*  PLT 147* 203   BMET Recent Labs    09/30/24 0749 10/01/24 0405  NA 133* 131*  K 3.2* 3.3*  CL 97* 96*  CO2 21* 24  GLUCOSE 91 83  BUN 7* 6*  CREATININE 0.66 0.72  CALCIUM 8.3* 8.0*   PT/INR No results for input(s): LABPROT, INR in the last 72 hours. ABG No results for input(s): PHART, HCO3 in the last 72 hours.  Invalid input(s): PCO2, PO2  Studies/Results: No results found.  Anti-infectives: Anti-infectives (From admission, onward)    Start     Dose/Rate Route Frequency Ordered Stop   09/27/24 1645  ceFAZolin (ANCEF) IVPB 1 g/50 mL premix        1 g 100 mL/hr over 30 Minutes Intravenous  Once 09/27/24 1641 09/27/24 1715       Assessment/Plan: 88 y/o F s/p GLF on Eliquis  Ruptured spleen with 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 Sebastian, will need splenectomy vaccines prior to discharge ABL anemia - due to above;  CBC P this AM but Hb has been stable Posterior scalp hematoma   Rhabdomyolysis - CK cleared. Off bicarb. AKI - resolved Elevated troponin - trended back down, suspect demand ischemia, continue cardiac monitoring; EKG with afib without ST changes; Acute post-operative VDRF - extubated 11/15 and doing well Hypokalemia, hypomagnesemia - replete Acute urinary retention - add flomax, I&O HTN - home meds resumed, trend Afib - hold DOAC; home cardizem   FEN - reg diet, D/C oxy, increase robaxin, add ultram VTE - SCD's, hold chemical VTE in the setting of ABL anemia ID - got ancef periop Foley - TOV today    Dispo: Transfer to PCU. Therapies. I spoke with her daughter at the bedside and answered her questions.  LOS: 4 days    Dann Sebastian, MD, MPH, FACS Trauma & General Surgery Use [email or web address] to contact on call provider  10/01/2024

## 2024-10-01 NOTE — Progress Notes (Signed)
 Inpatient Rehab Coordinator Note:  I met with patient and her son, Ozell, at bedside to discuss CIR recommendations and goals/expectations of CIR stay.  We reviewed 3 hrs/day of therapy, physician follow up, and average length of stay 2 weeks (dependent upon progress) with goals of supervision to mod I.  Family can provide 24/7 supervision at discharge.  We reviewed need for insurance auth.  I will start that process today.   Reche Lowers, PT, DPT Admissions Coordinator [phone] 10/01/24 2:38 PM

## 2024-10-02 LAB — CBC
HCT: 31.6 % — ABNORMAL LOW (ref 36.0–46.0)
Hemoglobin: 10.5 g/dL — ABNORMAL LOW (ref 12.0–15.0)
MCH: 28.8 pg (ref 26.0–34.0)
MCHC: 33.2 g/dL (ref 30.0–36.0)
MCV: 86.6 fL (ref 80.0–100.0)
Platelets: 269 K/uL (ref 150–400)
RBC: 3.65 MIL/uL — ABNORMAL LOW (ref 3.87–5.11)
RDW: 15.8 % — ABNORMAL HIGH (ref 11.5–15.5)
WBC: 12.1 K/uL — ABNORMAL HIGH (ref 4.0–10.5)
nRBC: 2.5 % — ABNORMAL HIGH (ref 0.0–0.2)

## 2024-10-02 LAB — BASIC METABOLIC PANEL WITH GFR
Anion gap: 11 (ref 5–15)
BUN: 7 mg/dL — ABNORMAL LOW (ref 8–23)
CO2: 23 mmol/L (ref 22–32)
Calcium: 8.6 mg/dL — ABNORMAL LOW (ref 8.9–10.3)
Chloride: 96 mmol/L — ABNORMAL LOW (ref 98–111)
Creatinine, Ser: 0.72 mg/dL (ref 0.44–1.00)
GFR, Estimated: >60 mL/min (ref >60)
Glucose, Bld: 81 mg/dL (ref 70–99)
Potassium: 4.3 mmol/L (ref 3.5–5.1)
Sodium: 130 mmol/L — ABNORMAL LOW (ref 135–145)

## 2024-10-02 MED ORDER — BETHANECHOL CHLORIDE 10 MG PO TABS
5.0000 mg | ORAL_TABLET | Freq: Three times a day (TID) | ORAL | Status: DC
  Administered 2024-10-02 – 2024-10-09 (×22): 5 mg via ORAL
  Filled 2024-10-02 (×22): qty 1

## 2024-10-02 MED ORDER — POLYETHYLENE GLYCOL 3350 17 G PO PACK
17.0000 g | PACK | Freq: Two times a day (BID) | ORAL | Status: DC
  Administered 2024-10-02 – 2024-10-04 (×4): 17 g via ORAL
  Filled 2024-10-02 (×4): qty 1

## 2024-10-02 MED ORDER — MORPHINE SULFATE (PF) 2 MG/ML IV SOLN
2.0000 mg | INTRAVENOUS | Status: DC | PRN
  Filled 2024-10-02: qty 1

## 2024-10-02 MED ORDER — ENOXAPARIN SODIUM 30 MG/0.3ML IJ SOSY
30.0000 mg | PREFILLED_SYRINGE | Freq: Two times a day (BID) | INTRAMUSCULAR | Status: DC
  Administered 2024-10-02 – 2024-10-09 (×15): 30 mg via SUBCUTANEOUS
  Filled 2024-10-02 (×15): qty 0.3

## 2024-10-02 MED ORDER — TRAMADOL HCL 50 MG PO TABS
50.0000 mg | ORAL_TABLET | Freq: Four times a day (QID) | ORAL | Status: DC | PRN
  Administered 2024-10-02 – 2024-10-03 (×2): 50 mg via ORAL
  Administered 2024-10-03: 100 mg via ORAL
  Administered 2024-10-03: 50 mg via ORAL
  Administered 2024-10-04: 100 mg via ORAL
  Filled 2024-10-02 (×3): qty 1
  Filled 2024-10-02 (×2): qty 2

## 2024-10-02 MED ORDER — SODIUM CHLORIDE 0.9 % IV BOLUS
500.0000 mL | Freq: Once | INTRAVENOUS | Status: AC
  Administered 2024-10-02: 500 mL via INTRAVENOUS

## 2024-10-02 MED ORDER — SENNOSIDES-DOCUSATE SODIUM 8.6-50 MG PO TABS
1.0000 | ORAL_TABLET | Freq: Two times a day (BID) | ORAL | Status: DC
  Administered 2024-10-02 – 2024-10-04 (×4): 1 via ORAL
  Filled 2024-10-02 (×11): qty 1

## 2024-10-02 MED ORDER — LIDOCAINE 5 % EX PTCH
2.0000 | MEDICATED_PATCH | CUTANEOUS | Status: DC
  Administered 2024-10-02 – 2024-10-07 (×6): 2 via TRANSDERMAL
  Filled 2024-10-02 (×7): qty 2

## 2024-10-02 NOTE — Progress Notes (Signed)
 Occupational Therapy Treatment Patient Details Name: Sarah Bird MRN: [id number] DOB: [birth year] Today's Date: 10/02/2024   History of present illness 88 y/o F presenting to [facility] on 11/14 after fall, workup significant for posterior scalp soft tissue injury, splenic rupture with hemoperitoneum and acute hemorrhage in gastrosplenic ligament. 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 CT head, C spine, chest, abodome, pelvis negative.   PMH includes A fib on Eliquis, HTN, basal cell carcinoma, UTI   OT comments  Pt progressing toward established OT goals. Pt limited this session by decr activity tolerance. Pt noted with slightly increased WOB with functional mobility to sink in restroom and with desatting as low as 80%, however, poor pleth. Pt befitting from standing rest break prior to oral care and seated rest after oral care prior to continuing to perform standing tasks. Left in chair with all needs met, son present. RN notified pt asking for pain education. Recommending intensive multidisciplinary rehabilitation >3 hours/day to optimize safety and independence in ADL.        If plan is discharge home, recommend the following:  A little help with walking and/or transfers;A lot of help with bathing/dressing/bathroom;Assistance with cooking/housework;Assist for transportation;Help with stairs or ramp for entrance   Equipment Recommendations  Other (comment);BSC/3in1 (RW)    Recommendations for Other Services      Precautions / Restrictions Precautions Precautions: Fall Precaution/Restrictions Comments: midline incision, abd binder utilized during session; watch sats Restrictions Weight Bearing Restrictions Per Provider Order: No       Mobility Bed Mobility Overal bed mobility: Needs Assistance Bed Mobility: Rolling, Sidelying to Sit Rolling: Supervision Sidelying to sit: Mod assist       General bed mobility comments: significant cueing for log roll technique     Transfers Overall transfer level: Needs assistance Equipment used: Rolling walker (2 wheels) Transfers: Sit to/from Stand Sit to Stand: Min assist           General transfer comment: assist for rise and steady, cues for correct hand placement when rising and sitting.     Balance Overall balance assessment: Needs assistance Sitting-balance support: Feet supported Sitting balance-Leahy Scale: Good Sitting balance - Comments: sits unsupported in chair   Standing balance support: During functional activity, Reliant on assistive device for balance, Bilateral upper extremity supported Standing balance-Leahy Scale: Poor Standing balance comment: reliant on external support                           ADL either performed or assessed with clinical judgement   ADL Overall ADL's : Needs assistance/impaired     Grooming: Contact guard assist;Standing;Wash/dry face;Oral care Grooming Details (indicate cue type and reason): needed one seated rest break             Lower Body Dressing: Moderate assistance;Sitting/lateral leans   Toilet Transfer: Minimal assistance;Ambulation;Rolling walker (2 wheels);Regular Toilet           Functional mobility during ADLs: Contact guard assist;Rolling walker (2 wheels)      Extremity/Trunk Assessment              Vision       Perception     Praxis     Communication Communication Communication: No apparent difficulties   Cognition Arousal: Alert Behavior During Therapy: WFL for tasks assessed/performed, Flat affect Cognition: No apparent impairments  Following commands: Intact        Cueing   Cueing Techniques: Verbal cues  Exercises      Shoulder Instructions       General Comments      Pertinent Vitals/ Pain       Pain Assessment Pain Assessment: Faces Faces Pain Scale: Hurts even more Pain Location: abdomen Pain Descriptors / Indicators: Discomfort,  Grimacing, Guarding Pain Intervention(s): Limited activity within patient's tolerance, Monitored during session  Home Living                                          Prior Functioning/Environment              Frequency  Min 2X/week        Progress Toward Goals  OT Goals(current goals can now be found in the care plan section)  Progress towards OT goals: Progressing toward goals  Acute Rehab OT Goals Time For Goal Achievement: 10/14/24 Potential to Achieve Goals: Good ADL Goals Pt Will Perform Grooming: with supervision;sitting Pt Will Perform Upper Body Dressing: with supervision;sitting;standing Pt Will Perform Lower Body Dressing: with supervision;sitting/lateral leans;sit to/from stand Pt Will Transfer to Toilet: with supervision;ambulating;regular height toilet Pt Will Perform Tub/Shower Transfer: Shower transfer;with supervision;ambulating;shower seat  Plan      Co-evaluation                 AM-PAC OT 6 Clicks Daily Activity     Outcome Measure   Help from another person eating meals?: A Little Help from another person taking care of personal grooming?: A Little Help from another person toileting, which includes using toliet, bedpan, or urinal?: A Little Help from another person bathing (including washing, rinsing, drying)?: A Lot Help from another person to put on and taking off regular upper body clothing?: A Little Help from another person to put on and taking off regular lower body clothing?: A Lot 6 Click Score: 16    End of Session Equipment Utilized During Treatment: Rolling walker (2 wheels)  OT Visit Diagnosis: Unsteadiness on feet (R26.81);Other abnormalities of gait and mobility (R26.89);Muscle weakness (generalized) (M62.81);History of falling (Z91.81)   Activity Tolerance Patient tolerated treatment well   Patient Left in bed;with bed alarm set;with call bell/phone within reach;with family/visitor present   Nurse  Communication Mobility status        Time: 8384-8354 OT Time Calculation (min): 30 min  Charges: OT General Charges $OT Visit: 1 Visit OT Treatments $Self Care/Home Management : 23-37 mins  [name], OTR/L [facility] Acute Rehabilitation Office: [phone]   [name] 10/02/2024, 5:09 PM

## 2024-10-02 NOTE — Progress Notes (Signed)
 Patient ID: Sarah Bird, female   DOB: 11/21/1935, 88 y.o.   MRN: [id number] 5 Days Post-Op    Subjective: Just woke up. C/o stable left flank/LUQ pain. Tolerating PO but not eating much. Foley had to be replaced yesterday. Patient thinks her last BM was on the day she fell down.   ROS negative except as listed above. Objective: Vital signs in last 24 hours: Temp:  [97.9 F (36.6 C)-98.8 F (37.1 C)] 98.3 F (36.8 C) (11/19 1057) Pulse Rate:  [70-113] 94 (11/19 1057) Resp:  [12-21] 20 (11/19 1057) BP: (87-132)/(56-79) 109/74 (11/19 1057) SpO2:  [91 %-99 %] 96 % (11/19 1057) Last BM Date :  (PTA)  Intake/Output from previous day: 11/18 0701 - 11/19 0700 In: 50.1 [IV Piggyback:50.1] Out: 2000 [Urine:2000] Intake/Output this shift: No intake/output data recorded.  Gen: alert, NAD HEENT: wound R nose from recent procedure - no evidence of infection Pulm: normal effort ORA CV: RRR, there is no lower extremity edema  Abd: soft, +BS, nondistended, mild RLQ tenderness without guarding. Staples c/d/I Msk: extremities symmetrical without deformity  Lab Results: CBC  Recent Labs    10/01/24 0405 10/02/24 0333  WBC 11.9* 12.1*  HGB 10.8* 10.5*  HCT 32.2* 31.6*  PLT 203 269   BMET Recent Labs    10/01/24 0405 10/02/24 0333  NA 131* 130*  K 3.3* 4.3  CL 96* 96*  CO2 24 23  GLUCOSE 83 81  BUN 6* 7*  CREATININE 0.72 0.72  CALCIUM 8.0* 8.6*   PT/INR No results for input(s): LABPROT, INR in the last 72 hours. ABG No results for input(s): PHART, HCO3 in the last 72 hours.  Invalid input(s): PCO2, PO2  Studies/Results: No results found.  Anti-infectives: Anti-infectives (From admission, onward)    Start     Dose/Rate Route Frequency Ordered Stop   09/27/24 1645  ceFAZolin (ANCEF) IVPB 1 g/50 mL premix        1 g 100 mL/hr over 30 Minutes Intravenous  Once 09/27/24 1641 09/27/24 1715       Assessment/Plan: 88 y/o F s/p GLF on Eliquis  Ruptured  spleen with 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 Sebastian, will need splenectomy vaccines prior to discharge ABL anemia - due to above; hgb stable at 10.5 from 10.8.  Posterior scalp hematoma  Rhabdomyolysis - CK cleared. Off bicarb. AKI - resolved Elevated troponin - trended back down, suspect demand ischemia, continue cardiac monitoring; EKG with afib without ST changes; Acute post-operative VDRF - extubated 11/15 and doing well Hyponatremia- 130 today. Patient appears euvolemic vs hypovolemic on exam. Hold lasix today. May benefit from normal saline bolus pending PO intake and UOP. Will monitor closely. Acute urinary retention - foley replaced 11/19, on flomax, add urecholine HTN - home meds resumed, trend Afib - hold DOAC; home cardizem; will look into DOAC prescriber - cardiologist vs PCP. Risk vs benefit of resumption due to her recurrent falls.   FEN - reg diet, increase tramadol; oxy stopped 11/18 due to dizziness. Wean morphine. VTE - SCD's, start lovenox today for DVT ppx ID - got ancef periop Foley - failed TOV, foley replaced 11/18 PM   Dispo: progressive care, PT/OT   LOS: 5 days   Sarah Bird, [facility] Surgery Please see Amion for pager number during day hours 7:00am-4:30pm       10/02/2024

## 2024-10-02 NOTE — TOC Initial Note (Signed)
 Transition of Care [facility]) - Initial/Assessment Note    Patient Details  Name: Sarah Bird MRN: [id number] Date of Birth: [birth year]  Transition of Care [facility]) CM/SW Contact:    Josepha Mliss HERO, RN Phone Number: 10/02/2024, 4:07 PM  Clinical Narrative:                 88 y/o F presenting to [facility] on 11/14 after fall, workup significant for posterior scalp soft tissue injury, splenic rupture with hemoperitoneum and acute hemorrhage in gastrosplenic ligament. Transferred to [facility], s/p open splenectomy on 11/14. CT head, C spine, chest, abodome, pelvis negative.  PTA, pt independent and living at home alone.  PT/OT recommending CIR, and family able to provide 24h support at discharge.   Insurance auth currently pending for inpatient rehab; will continue to follow.   Expected Discharge Plan: IP Rehab Facility Barriers to Discharge: Continued Medical Work up              Expected Discharge Plan and Services   Discharge Planning Services: CM Consult Post Acute Care Choice: IP Rehab Living arrangements for the past 2 months: Single Family Home                                      Prior Living Arrangements/Services Living arrangements for the past 2 months: Single Family Home Lives with:: Self Patient language and need for interpreter reviewed:: Yes Do you feel safe going back to the place where you live?: Yes      Need for Family Participation in Patient Care: Yes (Comment) Care giver support system in place?: Yes (comment)   Criminal Activity/Legal Involvement Pertinent to Current Situation/Hospitalization: No - Comment as needed  Activities of Daily Living   ADL Screening (condition at time of admission) Independently performs ADLs?: Yes (appropriate for developmental age) Is the patient deaf or have difficulty hearing?: No Does the patient have difficulty seeing, even when wearing glasses/contacts?: No Does the patient have difficulty concentrating, remembering,  or making decisions?: No                   Emotional Assessment   Attitude/Demeanor/Rapport: Engaged Affect (typically observed): Accepting Orientation: : Oriented to Self, Oriented to Place, Oriented to  Time, Oriented to Situation      Admission diagnosis:  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] Patient Active Problem List   Diagnosis Date Noted   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   Family history of colon cancer 09/22/2015   Encounter for screening colonoscopy 09/22/2015   PCP:  Lenon Layman ORN, MD Pharmacy:   [facility] - [place], [place] - [address] [place] [address] Phone: [phone] Fax: [phone]     Social Drivers of Health (SDOH) Social History: SDOH Screenings   Food Insecurity: No Food Insecurity (09/28/2024)  Housing: Low Risk  (09/28/2024)  Transportation Needs: No Transportation Needs (09/28/2024)  Utilities: Not At Risk (09/28/2024)  Financial Resource Strain: Low Risk  (08/26/2024)   Received from [facility]  Social Connections: Moderately Integrated (09/28/2024)  Tobacco Use: Low Risk  (09/27/2024)   SDOH Interventions:     Readmission Risk Interventions     No data to display         Mliss MICAEL Josepha, RN, BSN  Trauma/Neuro ICU Case Manager [phone]

## 2024-10-02 NOTE — Progress Notes (Signed)
  Progress Note   Date: 10/01/2024  Patient Name: Sarah Bird        MRN#: [id number]   Acute post-operative VDRF is integral to the surgical procedure and is not a complication

## 2024-10-02 NOTE — Progress Notes (Signed)
 Physical Therapy Treatment Patient Details Name: Sarah Bird MRN: [id number] DOB: [birth year] Today's Date: 10/02/2024   History of Present Illness 88 y/o F presenting to [facility] on 11/14 after fall, workup significant for posterior scalp soft tissue injury, splenic rupture with hemoperitoneum and acute hemorrhage in gastrosplenic ligament. Transferred to [facility], s/p open splenectomy on 11/14. CT head, C spine, chest, abodome, pelvis negative.   PMH includes A fib on Eliquis, HTN, basal cell carcinoma, UTI    PT Comments  Pt endorsing significant abdominal pain, but motivated to progress mobility. Pt tolerating bed mobility, transfers, and short-distance gait in room with use of RW. Pt noted to desat to 89-91% on RA during gait, recovered to 92% and greater on RA with seated rest. PT encouraged incentive spirometer use, anticipate pt deep diaphragmatic breathing limited by abdominal pain. PT to continue to follow, plan remains appropriate.      If plan is discharge home, recommend the following: A little help with walking and/or transfers;A little help with bathing/dressing/bathroom;Assistance with cooking/housework;Assist for transportation   Can travel by private vehicle        Equipment Recommendations  Rolling walker (2 wheels)    Recommendations for Other Services       Precautions / Restrictions Precautions Precautions: Fall Precaution/Restrictions Comments: midline incision, abd binder utilized during session; watch sats Restrictions Weight Bearing Restrictions Per Provider Order: No     Mobility  Bed Mobility Overal bed mobility: Needs Assistance Bed Mobility: Supine to Sit     Supine to sit: Mod assist, HOB elevated     General bed mobility comments: use of significant HOB elevation, assist for LE progression to EOB and trunk elevation via HHA using semi-roll towards L.    Transfers Overall transfer level: Needs assistance Equipment used: Rolling walker (2  wheels) Transfers: Sit to/from Stand Sit to Stand: Min assist           General transfer comment: assist for rise and steady, cues for correct hand placement when rising and sitting.    Ambulation/Gait Ambulation/Gait assistance: Min assist Gait Distance (Feet): 8 Feet Assistive device: Rolling walker (2 wheels) Gait Pattern/deviations: Step-through pattern, Decreased stride length, Trunk flexed Gait velocity: decr     General Gait Details: assist to steady, guide RW. SPO2 89-91% during gait but recovers 92% and greater at rest   Stairs             Wheelchair Mobility     Tilt Bed    Modified Rankin (Stroke Patients Only)       Balance Overall balance assessment: Needs assistance Sitting-balance support: Feet supported Sitting balance-Leahy Scale: Good Sitting balance - Comments: sits unsupported in chair   Standing balance support: During functional activity, Reliant on assistive device for balance, Bilateral upper extremity supported Standing balance-Leahy Scale: Poor Standing balance comment: reliant on external support                            Communication Communication Communication: No apparent difficulties  Cognition Arousal: Alert Behavior During Therapy: WFL for tasks assessed/performed, Flat affect   PT - Cognitive impairments: No apparent impairments                         Following commands: Intact      Cueing Cueing Techniques: Verbal cues  Exercises      General Comments General comments (skin integrity, edema, etc.): encouraged incentive spirometer 10x/hour,  2-3 at a time given pt pain and fatiguability      Pertinent Vitals/Pain Pain Assessment Pain Assessment: Faces Faces Pain Scale: Hurts even more Pain Location: abdomen Pain Descriptors / Indicators: Discomfort, Grimacing, Guarding Pain Intervention(s): Limited activity within patient's tolerance, Monitored during session, Repositioned    Home  Living                          Prior Function            PT Goals (current goals can now be found in the care plan section) Acute Rehab PT Goals Patient Stated Goal: Get well return home PT Goal Formulation: With patient Time For Goal Achievement: 10/15/24 Potential to Achieve Goals: Good Progress towards PT goals: Progressing toward goals    Frequency    Min 2X/week      PT Plan      Co-evaluation              AM-PAC PT 6 Clicks Mobility   Outcome Measure  Help needed turning from your back to your side while in a flat bed without using bedrails?: A Lot Help needed moving from lying on your back to sitting on the side of a flat bed without using bedrails?: A Lot Help needed moving to and from a bed to a chair (including a wheelchair)?: A Lot Help needed standing up from a chair using your arms (e.g., wheelchair or bedside chair)?: A Little Help needed to walk in hospital room?: A Little Help needed climbing 3-5 steps with a railing? : A Lot 6 Click Score: 14    End of Session Equipment Utilized During Treatment: Other (comment) (abd binder) Activity Tolerance: Patient tolerated treatment well;Patient limited by fatigue Patient left: in chair;with call bell/phone within reach;with family/visitor present   PT Visit Diagnosis: Unsteadiness on feet (R26.81);Other abnormalities of gait and mobility (R26.89);Muscle weakness (generalized) (M62.81);History of falling (Z91.81);Difficulty in walking, not elsewhere classified (R26.2);Pain     Time: 9090-9063 PT Time Calculation (min) (ACUTE ONLY): 27 min  Charges:    $Therapeutic Activity: 23-37 mins PT General Charges $$ ACUTE PT VISIT: 1 Visit                     Johana RAMAN, PT DPT Acute Rehabilitation Services Secure Chat Preferred  Office [phone]    Jameal Razzano E Johna 10/02/2024, 10:11 AM

## 2024-10-02 NOTE — Progress Notes (Signed)
 Inpatient Rehab Admissions Coordinator:   Auth for CIR is pending.  Will follow.   Reche Lowers, PT, DPT Admissions Coordinator [phone] 10/02/24 11:08 AM

## 2024-10-02 NOTE — Plan of Care (Signed)

## 2024-10-02 NOTE — Care Management Important Message (Signed)
 Important Message  Patient Details  Name: Sarah Bird MRN: [id number] Date of Birth: [birth year]   Important Message Given:  Yes - Medicare IM     [name] 10/02/2024, 11:42 AM

## 2024-10-03 ENCOUNTER — Inpatient Hospital Stay (HOSPITAL_COMMUNITY)

## 2024-10-03 LAB — CBC
HCT: 29.6 % — ABNORMAL LOW (ref 36.0–46.0)
Hemoglobin: 10.1 g/dL — ABNORMAL LOW (ref 12.0–15.0)
MCH: 29 pg (ref 26.0–34.0)
MCHC: 34.1 g/dL (ref 30.0–36.0)
MCV: 85.1 fL (ref 80.0–100.0)
Platelets: 364 K/uL (ref 150–400)
RBC: 3.48 MIL/uL — ABNORMAL LOW (ref 3.87–5.11)
RDW: 15.9 % — ABNORMAL HIGH (ref 11.5–15.5)
WBC: 7.6 K/uL (ref 4.0–10.5)
nRBC: 3.4 % — ABNORMAL HIGH (ref 0.0–0.2)

## 2024-10-03 LAB — MAGNESIUM: Magnesium: 1.7 mg/dL (ref 1.7–2.4)

## 2024-10-03 LAB — BASIC METABOLIC PANEL WITH GFR
Anion gap: 14 (ref 5–15)
BUN: 8 mg/dL (ref 8–23)
CO2: 19 mmol/L — ABNORMAL LOW (ref 22–32)
Calcium: 8.3 mg/dL — ABNORMAL LOW (ref 8.9–10.3)
Chloride: 101 mmol/L (ref 98–111)
Creatinine, Ser: 0.54 mg/dL (ref 0.44–1.00)
GFR, Estimated: >60 mL/min (ref >60)
Glucose, Bld: 82 mg/dL (ref 70–99)
Potassium: 3.8 mmol/L (ref 3.5–5.1)
Sodium: 134 mmol/L — ABNORMAL LOW (ref 135–145)

## 2024-10-03 MED ORDER — BISACODYL 10 MG RE SUPP: 10.0000 mg | Freq: Every day | RECTAL | Status: DC | PRN

## 2024-10-03 MED ORDER — GUAIFENESIN ER 600 MG PO TB12
600.0000 mg | ORAL_TABLET | Freq: Two times a day (BID) | ORAL | Status: DC
  Administered 2024-10-03 – 2024-10-09 (×13): 600 mg via ORAL
  Filled 2024-10-03 (×13): qty 1

## 2024-10-03 MED ORDER — MAGNESIUM OXIDE -MG SUPPLEMENT 400 (240 MG) MG PO TABS
400.0000 mg | ORAL_TABLET | Freq: Two times a day (BID) | ORAL | Status: AC
  Administered 2024-10-03 (×2): 400 mg via ORAL
  Filled 2024-10-03 (×2): qty 1

## 2024-10-03 MED ORDER — ENSURE PLUS HIGH PROTEIN PO LIQD
237.0000 mL | Freq: Two times a day (BID) | ORAL | Status: DC
  Administered 2024-10-03 – 2024-10-09 (×12): 237 mL via ORAL

## 2024-10-03 MED ORDER — MORPHINE SULFATE (PF) 2 MG/ML IV SOLN: 2.0000 mg | Freq: Three times a day (TID) | INTRAVENOUS | Status: DC | PRN

## 2024-10-03 NOTE — Progress Notes (Signed)
 Patient ID: Sarah Bird, female   DOB: [birth year], 88 y.o.   MRN: [id number] 6 Days Post-Op    Subjective: No new complaints. Up in the chair. Per daughter patient has been coughing a little more. Foley in place. No BM yet.   ROS negative except as listed above. Objective: Vital signs in last 24 hours: Temp:  [98.3 F (36.8 C)-99.6 F (37.6 C)] 98.7 F (37.1 C) (11/20 0418) Pulse Rate:  [85-94] 94 (11/20 0418) Resp:  [20-25] 25 (11/20 0418) BP: (109-127)/(72-75) 127/72 (11/20 0418) SpO2:  [93 %-96 %] 93 % (11/20 0418) Last BM Date :  (pta)  Intake/Output from previous day: 11/19 0701 - 11/20 0700 In: -  Out: 2160 [Urine:2160] Intake/Output this shift: No intake/output data recorded.  Gen: alert, NAD HEENT: wound R nose from recent procedure - no evidence of infection Pulm: normal effort ORA CV: RRR, there is no lower extremity edema  Abd: soft, +BS, nondistended, mild RLQ tenderness without guarding. Staples c/d/I - honeycomb removed. Msk: extremities symmetrical without deformity  Lab Results: CBC  Recent Labs    10/01/24 0405 10/02/24 0333  WBC 11.9* 12.1*  HGB 10.8* 10.5*  HCT 32.2* 31.6*  PLT 203 269   BMET Recent Labs    10/02/24 0333 10/03/24 0328  NA 130* 134*  K 4.3 3.8  CL 96* 101  CO2 23 19*  GLUCOSE 81 82  BUN 7* 8  CREATININE 0.72 0.54  CALCIUM  8.6* 8.3*   PT/INR No results for input(s): LABPROT, INR in the last 72 hours. ABG No results for input(s): PHART, HCO3 in the last 72 hours.  Invalid input(s): PCO2, PO2  Studies/Results: No results found.  Anti-infectives: Anti-infectives (From admission, onward)    Start     Dose/Rate Route Frequency Ordered Stop   09/27/24 1645  ceFAZolin  (ANCEF ) IVPB 1 g/50 mL premix        1 g 100 mL/hr over 30 Minutes Intravenous  Once 09/27/24 1641 09/27/24 1715       Assessment/Plan: 88 y/o F s/p GLF on Eliquis  Ruptured spleen with active hemorrhage - s/p open splenectomy 11/14  Dr. Sebastian, will need splenectomy vaccines prior to discharge ABL anemia - due to above; hgb stable at 10.5 from 10.8.  Posterior scalp hematoma  Rhabdomyolysis - CK cleared. Off bicarb. AKI - resolved Elevated troponin - trended back down, suspect demand ischemia, continue cardiac monitoring; EKG with afib without ST changes; Acute post-operative VDRF - extubated 11/15 and doing well Hyponatremia- improved, 134 from 130.  Acute urinary retention - foley replaced 11/19, on flomax , add urecholine  HTN - home meds resumed, trend Afib - hold DOAC; home cardizem ; this is prescribed by her PCP. Will send them a message regarding appropriateness of continuing in the setting of recurrent falls.   FEN - reg diet, tramadol ; oxy stopped 11/18 due to dizziness. Wean morphine . VTE - SCD's, lovenox  11/19 >> ID - got ancef  periop Foley - failed TOV, foley replaced 11/18 PM   Dispo: progressive care, PT/OT, medically stable for CIR   LOS: 6 days   Sarah Bird, [facility] Surgery Please see Amion for pager number during day hours 7:00am-4:30pm       10/03/2024

## 2024-10-03 NOTE — Progress Notes (Addendum)
 Inpatient Rehab Admissions Coordinator:   [facility] offering P2P.  Dr. Cornelio to complete at 1130 this AM.   Reche Lowers, PT, DPT Admissions Coordinator [phone] 10/03/24 10:09 AM

## 2024-10-04 LAB — BASIC METABOLIC PANEL WITH GFR
Anion gap: 7 (ref 5–15)
BUN: 12 mg/dL (ref 8–23)
CO2: 24 mmol/L (ref 22–32)
Calcium: 8.3 mg/dL — ABNORMAL LOW (ref 8.9–10.3)
Chloride: 99 mmol/L (ref 98–111)
Creatinine, Ser: 1.03 mg/dL — ABNORMAL HIGH (ref 0.44–1.00)
GFR, Estimated: 53 mL/min — ABNORMAL LOW (ref >60)
Glucose, Bld: 83 mg/dL (ref 70–99)
Potassium: 3.7 mmol/L (ref 3.5–5.1)
Sodium: 130 mmol/L — ABNORMAL LOW (ref 135–145)

## 2024-10-04 LAB — MAGNESIUM: Magnesium: 1.8 mg/dL (ref 1.7–2.4)

## 2024-10-04 LAB — GLUCOSE, CAPILLARY: Glucose-Capillary: 90 mg/dL (ref 70–99)

## 2024-10-04 MED ORDER — MAGNESIUM OXIDE -MG SUPPLEMENT 400 (240 MG) MG PO TABS
800.0000 mg | ORAL_TABLET | Freq: Two times a day (BID) | ORAL | Status: AC
  Administered 2024-10-04 (×2): 800 mg via ORAL
  Filled 2024-10-04 (×2): qty 2

## 2024-10-04 MED ORDER — MAGNESIUM CITRATE PO SOLN
1.0000 | Freq: Once | ORAL | Status: AC
  Administered 2024-10-04: 1 via ORAL
  Filled 2024-10-04: qty 296

## 2024-10-04 MED ORDER — SODIUM CHLORIDE 0.9 % IV BOLUS
500.0000 mL | Freq: Once | INTRAVENOUS | Status: AC
  Administered 2024-10-04: 500 mL via INTRAVENOUS

## 2024-10-04 NOTE — Progress Notes (Signed)
 Occupational Therapy Treatment Patient Details Name: Sarah Bird MRN: [id number] DOB: January 18, 1936 Today's Date: 10/04/2024   History of present illness 88 y/o F presenting to [facility] on 11/14 after fall, workup significant for posterior scalp soft tissue injury, splenic rupture with hemoperitoneum and acute hemorrhage in gastrosplenic ligament. 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 CT head, C spine, chest, abodome, pelvis negative.   PMH includes A fib on Eliquis, HTN, basal cell carcinoma, UTI   OT comments  Pt in bed upon therapy arrival with daughter visiting in room. OT assisted pt with peri care at bed level and in bathroom due to incontinence episode. Session focused on functional mobility, activity tolerance and safety awareness with use of AD. Pt continues to demonstrate decreased activity tolerance requiring increased time and increased physical assistance. Patient will benefit from intensive inpatient follow-up therapy, >3 hours/day.       If plan is discharge home, recommend the following:  A little help with walking and/or transfers;A lot of help with bathing/dressing/bathroom;Assistance with cooking/housework;Assist for transportation;Help with stairs or ramp for entrance   Equipment Recommendations  Other (comment);BSC/3in1 (RW)       Precautions / Restrictions Precautions Precautions: Fall Precaution/Restrictions Comments: midline incision; watch sats Restrictions Weight Bearing Restrictions Per Provider Order: No       Mobility Bed Mobility Overal bed mobility: Needs Assistance Bed Mobility: Rolling, Sidelying to Sit Rolling: Contact guard assist, Used rails Sidelying to sit: Mod assist, Used rails            Transfers Overall transfer level: Needs assistance Equipment used: Rolling walker (2 wheels) Transfers: Bed to chair/wheelchair/BSC, Sit to/from Stand Sit to Stand: Mod assist     Step pivot transfers: Contact guard assist     General  transfer comment: Assist for power up from bed and toilet.     Balance Overall balance assessment: Needs assistance Sitting-balance support: Feet supported Sitting balance-Leahy Scale: Good     Standing balance support: During functional activity, Reliant on assistive device for balance, Bilateral upper extremity supported Standing balance-Leahy Scale: Poor Standing balance comment: reliant on external support      ADL either performed or assessed with clinical judgement   ADL    Lower Body Bathing: Maximal assistance;Sit to/from stand   Upper Body Dressing : Minimal assistance;Sitting   Lower Body Dressing: Maximal assistance;Sit to/from stand   Toilet Transfer: Minimal assistance;Regular Toilet;Rolling walker (2 wheels);Grab bars;Ambulation   Toileting- Clothing Manipulation and Hygiene: Maximal assistance;Sit to/from stand Toileting - Clothing Manipulation Details (indicate cue type and reason): Due to bowel incontinence in bed and on way to toilet                      Communication Communication Communication: No apparent difficulties   Cognition Arousal: Alert Behavior During Therapy: WFL for tasks assessed/performed Cognition: No apparent impairments     Following commands: Intact        Cueing   Cueing Techniques: Verbal cues        General Comments encouraged IS, LE exercises up in chair    Pertinent Vitals/ Pain       Pain Assessment Pain Assessment: Faces Faces Pain Scale: Hurts little more Pain Location: abdomen during bed mobility Pain Descriptors / Indicators: Discomfort, Grimacing Pain Intervention(s): Limited activity within patient's tolerance, Monitored during session         Frequency  Min 2X/week        Progress Toward Goals  OT Goals(current goals  can now be found in the care plan section)  Progress towards OT goals: Progressing toward goals            AM-PAC OT 6 Clicks Daily Activity     Outcome Measure   Help  from another person eating meals?: A Little Help from another person taking care of personal grooming?: A Little Help from another person toileting, which includes using toliet, bedpan, or urinal?: A Lot Help from another person bathing (including washing, rinsing, drying)?: A Lot Help from another person to put on and taking off regular upper body clothing?: A Little Help from another person to put on and taking off regular lower body clothing?: A Lot 6 Click Score: 15    End of Session Equipment Utilized During Treatment: Rolling walker (2 wheels)  OT Visit Diagnosis: Unsteadiness on feet (R26.81);Other abnormalities of gait and mobility (R26.89);Muscle weakness (generalized) (M62.81);History of falling (Z91.81)   Activity Tolerance Patient tolerated treatment well   Patient Left in chair;with call bell/phone within reach;with nursing/sitter in room;with family/visitor present           Time: 8478-8446 OT Time Calculation (min): 32 min  Charges: OT General Charges $OT Visit: 1 Visit OT Treatments $Self Care/Home Management : 23-37 mins  Sarah Bird, OTR/L,CBIS  Supplemental OT - [facility] and [facility] Secure Chat Preferred    Sarah Bird, Sarah BIRCH 10/04/2024, 4:13 PM

## 2024-10-04 NOTE — Progress Notes (Signed)
 Inpatient Rehab Admissions Coordinator:   Met with pt and her daughter at bedside.  I let them know insurance denied request for CIR and they would like to appeal decision.  I will send that today.  Expect response within 72 hours.   Reche Lowers, PT, DPT Admissions Coordinator [phone] 10/04/24 12:17 PM

## 2024-10-04 NOTE — TOC Progression Note (Addendum)
 Transition of Care [facility]) - Progression Note    Patient Details  Name: SKYLEEN BENTLEY MRN: [id number] Date of Birth: 1936/04/03  Transition of [facility]) CM/SW Contact  Aliene Tamura E Meral Geissinger, LCSW Phone Number: 10/04/2024, 12:50 PM  Clinical Narrative:    Spoke with daughter Rosaline who is at bedside. Patient and family are very hopeful that CIR appeal will be approved as they feel patient will benefit from CIR. They are agreeable to SNF work up as a back up plan. They prefer [facility] [facility] area. Top preferences are [facility] or [facility] (however [facility] does not take [facility] notified). They do not want [facility].   Expected Discharge Plan: IP Rehab Facility Barriers to Discharge: Continued Medical Work up               Expected Discharge Plan and Services   Discharge Planning Services: CM Consult Post Acute Care Choice: IP Rehab Living arrangements for the past 2 months: Single Family Home                                       Social Drivers of Health (SDOH) Interventions SDOH Screenings   Food Insecurity: No Food Insecurity (09/28/2024)  Housing: Low Risk  (09/28/2024)  Transportation Needs: No Transportation Needs (09/28/2024)  Utilities: Not At Risk (09/28/2024)  Financial Resource Strain: Low Risk  (08/26/2024)   Received from [facility]  Social Connections: Moderately Integrated (09/28/2024)  Tobacco Use: Low Risk  (09/27/2024)    Readmission Risk Interventions     No data to display

## 2024-10-04 NOTE — Progress Notes (Signed)
 Physical Therapy Treatment Patient Details Name: Sarah Bird MRN: [id number] DOB: 1935/11/19 Today's Date: 10/04/2024   History of Present Illness 88 y/o F presenting to [facility] on 11/14 after fall, workup significant for posterior scalp soft tissue injury, splenic rupture with hemoperitoneum and acute hemorrhage in gastrosplenic ligament. 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 CT head, C spine, chest, abodome, pelvis negative.   PMH includes A fib on Eliquis, HTN, basal cell carcinoma, UTI    PT Comments  Pt up in chair upon arrival to room, eager to progress mobility though stating she is having loose stools. Pt tolerating ambulation to/from bathroom, overall requiring min-mod power up and steadying assist during mobility. Pt with stool incontinence limiting tolerance today, but progressing well. PT To continue to follow, recommendations remain appropriate.      If plan is discharge home, recommend the following: A little help with bathing/dressing/bathroom;Assistance with cooking/housework;Assist for transportation;A lot of help with walking and/or transfers   Can travel by private vehicle        Equipment Recommendations  Rolling walker (2 wheels)    Recommendations for Other Services       Precautions / Restrictions Precautions Precautions: Fall Precaution/Restrictions Comments: midline incision, abd binder utilized during session; watch sats Restrictions Weight Bearing Restrictions Per Provider Order: No     Mobility  Bed Mobility Overal bed mobility: Needs Assistance             General bed mobility comments: up in chair    Transfers Overall transfer level: Needs assistance Equipment used: Rolling walker (2 wheels) Transfers: Sit to/from Stand Sit to Stand: Mod assist           General transfer comment: assist for power up, rise, steadying. stand x2, from recliner and toilet.    Ambulation/Gait Ambulation/Gait assistance: Min assist Gait  Distance (Feet): 10 Feet (x2 - to and from toilet) Assistive device: Rolling walker (2 wheels) Gait Pattern/deviations: Step-through pattern, Decreased stride length, Trunk flexed Gait velocity: decr     General Gait Details: assist to steady and guide RW, cues for upright posture. Pt limited by stool incontinence during gait   Stairs             Wheelchair Mobility     Tilt Bed    Modified Rankin (Stroke Patients Only)       Balance Overall balance assessment: Needs assistance Sitting-balance support: Feet supported Sitting balance-Leahy Scale: Good Sitting balance - Comments: sits unsupported in chair   Standing balance support: During functional activity, Reliant on assistive device for balance, Bilateral upper extremity supported Standing balance-Leahy Scale: Poor Standing balance comment: reliant on external support                            Communication Communication Communication: No apparent difficulties  Cognition Arousal: Alert Behavior During Therapy: WFL for tasks assessed/performed, Flat affect   PT - Cognitive impairments: No apparent impairments                         Following commands: Intact      Cueing Cueing Techniques: Verbal cues  Exercises General Exercises - Lower Extremity Ankle Circles/Pumps: AROM, Both, 5 reps, Seated Long Arc Quad: AROM, Both, Seated, 10 reps    General Comments General comments (skin integrity, edema, etc.): encouraged IS, LE exercises up in chair      Pertinent Vitals/Pain Pain Assessment Pain Assessment: Faces  Faces Pain Scale: Hurts even more Pain Location: abdomen Pain Descriptors / Indicators: Discomfort, Grimacing, Guarding Pain Intervention(s): Limited activity within patient's tolerance, Monitored during session, Repositioned    Home Living                          Prior Function            PT Goals (current goals can now be found in the care plan  section) Acute Rehab PT Goals Patient Stated Goal: Get well return home PT Goal Formulation: With patient Time For Goal Achievement: 10/15/24 Potential to Achieve Goals: Good Progress towards PT goals: Progressing toward goals    Frequency    Min 2X/week      PT Plan      Co-evaluation              AM-PAC PT 6 Clicks Mobility   Outcome Measure  Help needed turning from your back to your side while in a flat bed without using bedrails?: A Lot Help needed moving from lying on your back to sitting on the side of a flat bed without using bedrails?: A Lot Help needed moving to and from a bed to a chair (including a wheelchair)?: A Lot Help needed standing up from a chair using your arms (e.g., wheelchair or bedside chair)?: A Lot Help needed to walk in hospital room?: A Little Help needed climbing 3-5 steps with a railing? : A Lot 6 Click Score: 13    End of Session Equipment Utilized During Treatment: Other (comment) (abd binder) Activity Tolerance: Patient tolerated treatment well;Patient limited by fatigue Patient left: in chair;with call bell/phone within reach;with family/visitor present Nurse Communication: Mobility status PT Visit Diagnosis: Unsteadiness on feet (R26.81);Other abnormalities of gait and mobility (R26.89);Muscle weakness (generalized) (M62.81);History of falling (Z91.81);Difficulty in walking, not elsewhere classified (R26.2);Pain     Time: 1207-1234 PT Time Calculation (min) (ACUTE ONLY): 27 min  Charges:    $Gait Training: 8-22 mins $Self Care/Home Management: 8-22 PT General Charges $$ ACUTE PT VISIT: 1 Visit                     Johana RAMAN, PT DPT Acute Rehabilitation Services Secure Chat Preferred  Office [phone]    Marrietta Thunder FORBES [place] 10/04/2024, 12:44 PM

## 2024-10-04 NOTE — Progress Notes (Cosign Needed Addendum)
 Patient ID: Sarah Bird, female   DOB: 1936-01-07, 88 y.o.   MRN: [id number] 7 Days Post-Op    Subjective: Ongoing LUQ/L chest wall discomfort. Sitting up eating breakfast (100% muffin, yogurt, peaches). Denies BM.  ROS negative except as listed above. Objective: Vital signs in last 24 hours: Temp:  [98.6 F (37 C)-98.9 F (37.2 C)] 98.7 F (37.1 C) (11/21 0723) Pulse Rate:  [85-94] 94 (11/21 0723) Resp:  [19-25] 20 (11/21 0723) BP: (104-144)/(61-85) 144/85 (11/21 0723) SpO2:  [93 %-95 %] 93 % (11/21 0723) Last BM Date :  (PTA)  Intake/Output from previous day: 11/20 0701 - 11/21 0700 In: 120 [P.O.:120] Out: 1650 [Urine:1650] Intake/Output this shift: No intake/output data recorded.  Gen: alert, NAD HEENT: wound R nose from recent procedure - no evidence of infection Pulm: normal effort ORA CV: RRR, there is no lower extremity edema  Abd: soft, +BS, nondistended, mild left sided tenderness,. Staples c/d/I Msk: extremities symmetrical without deformity  Lab Results: CBC  Recent Labs    10/02/24 0333 10/03/24 1205  WBC 12.1* 7.6  HGB 10.5* 10.1*  HCT 31.6* 29.6*  PLT 269 364   BMET Recent Labs    10/03/24 0328 10/04/24 0207  NA 134* 130*  K 3.8 3.7  CL 101 99  CO2 19* 24  GLUCOSE 82 83  BUN 8 12  CREATININE 0.54 1.03*  CALCIUM  8.3* 8.3*   PT/INR No results for input(s): LABPROT, INR in the last 72 hours. ABG No results for input(s): PHART, HCO3 in the last 72 hours.  Invalid input(s): PCO2, PO2  Studies/Results: DG CHEST PORT 1 VIEW Result Date: 10/03/2024 CLINICAL DATA:  Cough. EXAM: PORTABLE CHEST 1 VIEW COMPARISON:  CT 09/27/2024.  Radiographs 09/27/2024. FINDINGS: 1432 hours. The heart size and mediastinal contours are stable with aortic atherosclerosis. New small left pleural effusion with associated left basilar airspace disease. Minimal streaky atelectasis at the right lung base. No pneumothorax or acute osseous abnormality.  Telemetry leads overlie the chest. IMPRESSION: New small left pleural effusion with associated left basilar airspace disease which may reflect atelectasis or pneumonia. Electronically Signed   By: Elsie Perone M.D.   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 Sebastian, will need splenectomy vaccines prior to discharge ABL anemia - due to above; hgb stable at 10.5 from 10.8.  Posterior scalp hematoma  Rhabdomyolysis - CK cleared. Off bicarb. AKI - resolved Elevated troponin - trended back down, suspect demand ischemia, continue cardiac monitoring; EKG with afib without ST changes; Acute post-operative VDRF - extubated 11/15 and doing well Hyponatremia- 130 today, hold lasix , give 500 mL NS Bolus; creatinine also increased to 1.03 from 0.5. Acute urinary retention - foley replaced 11/19, on flomax , add urecholine  HTN - home meds resumed, trend Afib - hold DOAC; home cardizem ; this is prescribed by her PCP. Will send them a message regarding appropriateness of continuing in the setting of recurrent falls.   FEN - reg diet, tramadol ; oxy stopped 11/18 due to dizziness. Wean morphine . VTE - SCD's, lovenox  11/19 >> ID -  got ancef  periop Foley - failed TOV, foley replaced 11/18 PM   Dispo: progressive care, PT/OT, medically stable for CIR - patient would like an appeal if she was denied. If denied will need SNF Placement.  Give mag citrate     LOS: 7 days   Sarah Bird, [facility] Surgery Please see Amion for pager number during day hours 7:00am-4:30pm       10/04/2024

## 2024-10-04 NOTE — Progress Notes (Addendum)
 Pt had a stage two pressure injury documented as a stage one with blister present when transferred to 4NP. When this RN did assessment blister noted to be opened, no drainage observed. Pressure injury cleansed and foam patch changed. WOC Melody aware.

## 2024-10-04 NOTE — NC FL2 (Signed)
 [place]  MEDICAID FL2 LEVEL OF CARE FORM     IDENTIFICATION  Patient Name: Sarah Bird Birthdate: 11-18-1935 Sex: female Admission Date (Current Location): 09/27/2024  [facility] and [place] Number:  Pro[employer], Television/film/video and Address:         Provider Number: [id number]  Attending Physician Name and Address:  Md, Trauma, MD  Relative Name and Phone Number:  Rosaline Fields [phone]    Current Level of Care:   Recommended Level of Care:   Prior Approval Number:    Date Approved/Denied:   PASRR Number: [id number]  Discharge Plan:      Current Diagnoses: Patient Active Problem List   Diagnosis Date Noted   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   Family history of colon cancer 09/22/2015   Encounter for screening colonoscopy 09/22/2015    Orientation RESPIRATION BLADDER Height & Weight     Self, Situation, Time, Place  Normal Indwelling catheter Weight:   Height:     BEHAVIORAL SYMPTOMS/MOOD NEUROLOGICAL BOWEL NUTRITION STATUS        Diet (reg)  AMBULATORY STATUS COMMUNICATION OF NEEDS Skin   Limited Assist Verbally  (L & R traumatic tibia - foam; surgicial incision abdomen - ABD; traumatic lower back - foam; stage 2 pressure injury R medial buttocks - foam; wound R nose)                       Personal Care Assistance Level of Assistance  Bathing, Feeding, Dressing Bathing Assistance: Limited assistance Feeding assistance: Limited assistance Dressing Assistance: Limited assistance     Functional Limitations Info             SPECIAL CARE FACTORS FREQUENCY  PT (By licensed PT), OT (By licensed OT)     PT Frequency: 5 times per week OT Frequency: 5 times per week            Contractures      Additional Factors Info  Code Status, Allergies Code Status Info: full Allergies Info: Actonel (Risedronate), Cozaar (Losartan), Sulfa Antibiotics           Current Medications (10/04/2024):  This is the current hospital active medication  list Current Facility-Administered Medications  Medication Dose Route Frequency Provider Last Rate Last Admin   0.9 %  sodium chloride  infusion  10 mL/hr Intravenous Once Vicci Burnard SAUNDERS, PA-C       acetaminophen  (TYLENOL ) tablet 1,000 mg  1,000 mg Oral Q6H Johnson, Kelly R, PA-C   1,000 mg at 10/04/24 9083   bethanechol  (URECHOLINE ) tablet 5 mg  5 mg Oral TID Simaan, Elizabeth S, PA-C   5 mg at 10/04/24 0917   bisacodyl  (DULCOLAX) suppository 10 mg  10 mg Rectal Daily PRN Augustus Almarie RAMAN, PA-C       Chlorhexidine  Gluconate Cloth 2 % PADS 6 each  6 each Topical Daily Vicci Burnard SAUNDERS, PA-C   6 each at 10/04/24 1139   diltiazem  (CARDIZEM  CD) 24 hr capsule 120 mg  120 mg Oral Daily Sebastian Moles, MD   120 mg at 10/04/24 9082   enoxaparin  (LOVENOX ) injection 30 mg  30 mg Subcutaneous Q12H Augustus Almarie RAMAN, PA-C   30 mg at 10/04/24 0058   feeding supplement (ENSURE PLUS HIGH PROTEIN) liquid 237 mL  237 mL Oral BID BM Hogan, Kendra Paige, PA-C   237 mL at 10/04/24 9082   guaiFENesin  (MUCINEX ) 12 hr tablet 600 mg  600 mg Oral BID Augustus Almarie RAMAN, PA-C   600 mg at  10/04/24 0917   labetalol  (NORMODYNE ) injection 10 mg  10 mg Intravenous Q2H PRN Dasie Leonor CROME, MD   10 mg at 09/30/24 0130   lidocaine  (LIDODERM ) 5 % 2 patch  2 patch Transdermal Q24H Simaan, Elizabeth S, PA-C   2 patch at 10/04/24 9082   magnesium  oxide (MAG-OX) tablet 800 mg  800 mg Oral BID Simaan, Elizabeth S, PA-C   800 mg at 10/04/24 9083   meclizine  (ANTIVERT ) tablet 12.5 mg  12.5 mg Oral TID PRN Vicci Burnard SAUNDERS, PA-C   12.5 mg at 09/29/24 1318   methocarbamol  (ROBAXIN ) tablet 750 mg  750 mg Oral TID Sebastian Moles, MD   750 mg at 10/04/24 9082   morphine  (PF) 2 MG/ML injection 2 mg  2 mg Intravenous Q8H PRN Simaan, Elizabeth S, PA-C       ondansetron  (ZOFRAN -ODT) disintegrating tablet 4 mg  4 mg Oral Q6H PRN Johnson, Kelly R, PA-C       Or   ondansetron  (ZOFRAN ) injection 4 mg  4 mg Intravenous Q6H PRN Vicci Burnard SAUNDERS, PA-C   4 mg at 09/30/24 9188   Oral care mouth rinse  15 mL Mouth Rinse PRN Vicci Burnard SAUNDERS, PA-C       polyethylene glycol (MIRALAX  / GLYCOLAX ) packet 17 g  17 g Oral BID Augustus Almarie RAMAN, PA-C   17 g at 10/04/24 0918   senna-docusate (Senokot-S) tablet 1 tablet  1 tablet Oral BID Simaan, Elizabeth S, PA-C   1 tablet at 10/04/24 9082   tamsulosin  (FLOMAX ) capsule 0.4 mg  0.4 mg Oral Daily Sebastian Moles, MD   0.4 mg at 10/04/24 9082   traMADol  (ULTRAM ) tablet 50-100 mg  50-100 mg Oral Q6H PRN Augustus Almarie RAMAN, PA-C   100 mg at 10/04/24 9941     Discharge Medications: Please see discharge summary for a list of discharge medications.  Relevant Imaging Results:  Relevant Lab Results:   Additional Information SS #: [id number]  Lamanda Rudder E Noble Cicalese, LCSW

## 2024-10-05 LAB — BASIC METABOLIC PANEL WITH GFR
Anion gap: 11 (ref 5–15)
BUN: 9 mg/dL (ref 8–23)
CO2: 24 mmol/L (ref 22–32)
Calcium: 8.7 mg/dL — ABNORMAL LOW (ref 8.9–10.3)
Chloride: 100 mmol/L (ref 98–111)
Creatinine, Ser: 0.69 mg/dL (ref 0.44–1.00)
GFR, Estimated: >60 mL/min (ref >60)
Glucose, Bld: 92 mg/dL (ref 70–99)
Potassium: 3.9 mmol/L (ref 3.5–5.1)
Sodium: 135 mmol/L (ref 135–145)

## 2024-10-05 LAB — CBC
HCT: 33.7 % — ABNORMAL LOW (ref 36.0–46.0)
Hemoglobin: 10.9 g/dL — ABNORMAL LOW (ref 12.0–15.0)
MCH: 28 pg (ref 26.0–34.0)
MCHC: 32.3 g/dL (ref 30.0–36.0)
MCV: 86.6 fL (ref 80.0–100.0)
Platelets: 496 K/uL — ABNORMAL HIGH (ref 150–400)
RBC: 3.89 MIL/uL (ref 3.87–5.11)
RDW: 16.5 % — ABNORMAL HIGH (ref 11.5–15.5)
WBC: 9.4 K/uL (ref 4.0–10.5)
nRBC: 1.7 % — ABNORMAL HIGH (ref 0.0–0.2)

## 2024-10-05 MED ORDER — POLYETHYLENE GLYCOL 3350 17 G PO PACK
17.0000 g | PACK | Freq: Every day | ORAL | Status: DC | PRN
Start: 2024-10-05 — End: 2024-10-09

## 2024-10-05 NOTE — Plan of Care (Signed)
  Problem: Education: Goal: Knowledge of General Education information will improve Description: Including pain rating scale, medication(s)/side effects and non-pharmacologic comfort measures Outcome: Progressing   Problem: Clinical Measurements: Goal: Ability to maintain clinical measurements within normal limits will improve Outcome: Progressing Goal: Respiratory complications will improve Outcome: Progressing Goal: Cardiovascular complication will be avoided Outcome: Progressing   Problem: Activity: Goal: Risk for activity intolerance will decrease Outcome: Progressing   Problem: Pain Managment: Goal: General experience of comfort will improve and/or be controlled Outcome: Progressing   Problem: Elimination: Goal: Will not experience complications related to urinary retention Outcome: Not Progressing

## 2024-10-05 NOTE — Progress Notes (Addendum)
 Patient ID: Sarah Bird, female   DOB: [birth year], 88 y.o.   MRN: [id number] 8 Days Post-Op    Subjective: Overall doing better. States she ate well yesterday. Got up and down to the bathroom - reports 5 BMs. Denies nausea or vomiting. Daughter at bedside.  ROS negative except as listed above. Objective: Vital signs in last 24 hours: Temp:  [97.9 F (36.6 C)-98.7 F (37.1 C)] 98.1 F (36.7 C) (11/22 0425) Pulse Rate:  [89-100] 93 (11/22 0425) Resp:  [20-22] 21 (11/22 0425) BP: (104-133)/(58-87) 129/70 (11/22 0425) SpO2:  [92 %-96 %] 96 % (11/22 0425) Last BM Date : 10/04/24  Intake/Output from previous day: 11/21 0701 - 11/22 0700 In: -  Out: 1000 [Urine:1000] Intake/Output this shift: No intake/output data recorded.  Gen: alert, NAD HEENT: wound R nose from recent procedure - no evidence of infection Pulm: normal effort ORA CV: RRR, there is no lower extremity edema  Abd: soft, +BS, nondistended, mild left sided tenderness,. Staples c/d/I Msk: extremities symmetrical without deformity  Lab Results: CBC  Recent Labs    10/03/24 1205  WBC 7.6  HGB 10.1*  HCT 29.6*  PLT 364   BMET Recent Labs    10/04/24 0207 10/05/24 0449  NA 130* 135  K 3.7 3.9  CL 99 100  CO2 24 24  GLUCOSE 83 92  BUN 12 9  CREATININE 1.03* 0.69  CALCIUM  8.3* 8.7*   PT/INR No results for input(s): LABPROT, INR in the last 72 hours. ABG No results for input(s): PHART, HCO3 in the last 72 hours.  Invalid input(s): PCO2, PO2  Studies/Results: DG CHEST PORT 1 VIEW Result Date: 10/03/2024 CLINICAL DATA:  Cough. EXAM: PORTABLE CHEST 1 VIEW COMPARISON:  CT 09/27/2024.  Radiographs 09/27/2024. FINDINGS: 1432 hours. The heart size and mediastinal contours are stable with aortic atherosclerosis. New small left pleural effusion with associated left basilar airspace disease. Minimal streaky atelectasis at the right lung base. No pneumothorax or acute osseous abnormality. Telemetry  leads overlie the chest. IMPRESSION: New small left pleural effusion with associated left basilar airspace disease which may reflect atelectasis or pneumonia. Electronically Signed   By: Elsie Perone M.D.   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 Sebastian, will need splenectomy vaccines prior to discharge ABL anemia - due to above; hgb stable at 10.5 from 10.8.  Posterior scalp hematoma  Rhabdomyolysis - CK cleared. Off bicarb. AKI - resolved Elevated troponin - trended back down, suspect demand ischemia, continue cardiac monitoring; EKG with afib without ST changes; Acute post-operative VDRF - extubated 11/15 and doing well Hyponatremia- resolved - 135 from 130 with additional IVF and holding lasix  yesterday. Hold lasix  again to today and consider resuming Sunday 11/23. Acute urinary retention - foley replaced 11/19, on flomax , urecholine  HTN - home meds resumed, trend Afib - hold DOAC; home cardizem ; this is prescribed by her PCP. Will send them a message regarding appropriateness of continuing in the setting of recurrent falls.   FEN - reg diet, tramadol ; oxy stopped 11/18 due to dizziness. Wean  morphine . VTE - SCD's, lovenox  11/19 >> ID - got ancef  periop Foley - failed TOV, foley replaced 11/18 PM   Dispo: progressive care, PT/OT, medically stable for CIR - patient would like an appeal if she was denied. If denied will need SNF Placement.  Give mag citrate     LOS: 8 days   [name], [facility] Please see Amion for pager number during day hours 7:00am-4:30pm       10/05/2024

## 2024-10-06 LAB — BASIC METABOLIC PANEL WITH GFR
Anion gap: 10 (ref 5–15)
BUN: 9 mg/dL (ref 8–23)
CO2: 22 mmol/L (ref 22–32)
Calcium: 8.4 mg/dL — ABNORMAL LOW (ref 8.9–10.3)
Chloride: 104 mmol/L (ref 98–111)
Creatinine, Ser: 0.63 mg/dL (ref 0.44–1.00)
GFR, Estimated: >60 mL/min (ref >60)
Glucose, Bld: 80 mg/dL (ref 70–99)
Potassium: 3.8 mmol/L (ref 3.5–5.1)
Sodium: 136 mmol/L (ref 135–145)

## 2024-10-06 NOTE — Progress Notes (Signed)
 Patient ID: Sarah Bird, female   DOB: Jul 28, 1936, 88 y.o.   MRN: [id number] 9 Days Post-Op    Subjective: Overall doing better. Diarrhea improved. Tolerating PO.   ROS negative except as listed above. Objective: Vital signs in last 24 hours: Temp:  [98.1 F (36.7 C)-98.7 F (37.1 C)] 98.1 F (36.7 C) (11/23 0730) Pulse Rate:  [87-103] 103 (11/23 0730) Resp:  [16-30] 30 (11/23 0730) BP: (92-145)/(65-90) 135/90 (11/23 0730) SpO2:  [93 %-100 %] 96 % (11/23 0730) Last BM Date : 10/05/24  Intake/Output from previous day: 11/22 0701 - 11/23 0700 In: -  Out: 1900 [Urine:1900] Intake/Output this shift: No intake/output data recorded.  Gen: alert, NAD HEENT: wound R nose from recent procedure - no evidence of infection Pulm: normal effort ORA CV: RRR, there is no lower extremity edema  Abd: soft, +BS, nondistended, mild left sided tenderness,. Staples c/d/I GU: foley in place draining clear yellow urine. Msk: extremities symmetrical without deformity  Lab Results: CBC  Recent Labs    10/03/24 1205 10/05/24 0449  WBC 7.6 9.4  HGB 10.1* 10.9*  HCT 29.6* 33.7*  PLT 364 496*   BMET Recent Labs    10/05/24 0449 10/06/24 0426  NA 135 136  K 3.9 3.8  CL 100 104  CO2 24 22  GLUCOSE 92 80  BUN 9 9  CREATININE 0.69 0.63  CALCIUM  8.7* 8.4*   PT/INR No results for input(s): LABPROT, INR in the last 72 hours. ABG No results for input(s): PHART, HCO3 in the last 72 hours.  Invalid input(s): PCO2, PO2  Studies/Results: No results found.   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 Sebastian, will need splenectomy  vaccines prior to discharge ABL anemia - due to above; hgb stable at 10.5 from 10.8.  Posterior scalp hematoma  Rhabdomyolysis - CK cleared. Off bicarb. AKI - resolved Elevated troponin - trended back down, suspect demand ischemia, continue cardiac monitoring; EKG with afib without ST changes; Acute post-operative VDRF - extubated 11/15 and doing well Hyponatremia- resolved - 136 from 130 with additional IVF and holding lasix  . Hold lasix . Patient able to tell me on 11/23 that she was told to take lasix  as needed but does not know how to tell if she needs it so she just takes it every day. Monitor and will need to adjust based on her needs. Acute urinary retention - foley replaced 11/19, on flomax , urecholine , TOV today HTN - home meds resumed, trend Afib - hold DOAC; home cardizem ; this is prescribed by her PCP. Will send them a message regarding appropriateness of continuing in the setting of recurrent falls.   FEN - reg diet, tramadol ; oxy stopped 11/18 due to dizziness. Wean morphine . VTE - SCD's, lovenox  11/19 >> ID - got ancef  periop Foley - failed TOV, foley replaced 11/18 PM, repeat TOV today 11/23   Dispo: progressive care, PT/OT, medically stable for CIR - appeal is pending.  Give mag citrate     LOS: 9 days   [name], [facility] Please see Amion for pager number during  day hours 7:00am-4:30pm       10/06/2024

## 2024-10-06 NOTE — Plan of Care (Signed)
  Problem: Education: Goal: Knowledge of General Education information will improve Description: Including pain rating scale, medication(s)/side effects and non-pharmacologic comfort measures Outcome: Progressing   Problem: Clinical Measurements: Goal: Ability to maintain clinical measurements within normal limits will improve Outcome: Progressing Goal: Respiratory complications will improve Outcome: Progressing   Problem: Activity: Goal: Risk for activity intolerance will decrease Outcome: Progressing   Problem: Pain Managment: Goal: General experience of comfort will improve and/or be controlled Outcome: Progressing

## 2024-10-07 NOTE — Progress Notes (Signed)
 Inpatient Rehab Admissions Coordinator:   Awaiting determination for expedited appeal submitted to [facility] on Friday.  Will follow.   Reche Lowers, PT, DPT Admissions Coordinator [phone] 10/07/24 12:22 PM

## 2024-10-07 NOTE — Progress Notes (Signed)
 Occupational Therapy Treatment Patient Details Name: Sarah Bird MRN: [id number] DOB: 1936/04/14 Today's Date: 10/07/2024   History of present illness 88 y/o F presenting to [facility] on 11/14 after fall, workup significant for posterior scalp soft tissue injury, splenic rupture with hemoperitoneum and acute hemorrhage in gastrosplenic ligament. Transferred to [facility], s/p open splenectomy on 11/14. CT head, C spine, chest, abodome, pelvis negative.   PMH includes A fib on Eliquis, HTN, basal cell carcinoma, UTI   OT comments  Pt is making good progress towards their acute OT goals. Pt does complain of abdominal pain but is able to mobilize >household distance with CGA and RW. Pt recently completed ADLs therefore politely declined this session. OT to continue to follow acutely to facilitate progress towards established goals. Pt will continue to benefit from intensive inpatient follow up therapy, >3 hours/day after discharge.        If plan is discharge home, recommend the following:  A little help with walking and/or transfers;A lot of help with bathing/dressing/bathroom;Assistance with cooking/housework;Assist for transportation;Help with stairs or ramp for entrance   Equipment Recommendations  Other (comment);BSC/3in1    Recommendations for Other Services PT consult;Rehab consult    Precautions / Restrictions Precautions Precautions: Fall Recall of Precautions/Restrictions: Impaired Precaution/Restrictions Comments: midline incision; abdominal binder; watch sats Restrictions Weight Bearing Restrictions Per Provider Order: No       Mobility Bed Mobility               General bed mobility comments: OOB on arrival    Transfers Overall transfer level: Needs assistance Equipment used: Rolling walker (2 wheels) Transfers: Sit to/from Stand Sit to Stand: Contact guard assist           General transfer comment: 2x attempts to successfully stand without assist     Balance    Sitting-balance support: Feet supported Sitting balance-Leahy Scale: Good                                     ADL either performed or assessed with clinical judgement   ADL Overall ADL's : Needs assistance/impaired                         Toilet Transfer: Contact guard assist;Ambulation;Rolling walker (2 wheels) Toilet Transfer Details (indicate cue type and reason): no physical assist, CGA for safety. pt demonstrated good management of RW         Functional mobility during ADLs: Contact guard assist;Rolling walker (2 wheels) General ADL Comments: limite dby pain and activity tolerance    Extremity/Trunk Assessment Upper Extremity Assessment Upper Extremity Assessment: Overall WFL for tasks assessed;Generalized weakness   Lower Extremity Assessment Lower Extremity Assessment: Defer to PT evaluation        Vision   Vision Assessment?: No apparent visual deficits   Perception Perception Perception: Within Functional Limits   Praxis Praxis Praxis: WFL   Communication Communication Communication: No apparent difficulties   Cognition Arousal: Alert Behavior During Therapy: WFL for tasks assessed/performed Cognition: No apparent impairments                               Following commands: Intact        Cueing   Cueing Techniques: Verbal cues        General Comments VSS,    Pertinent Vitals/ Pain  Pain Assessment Pain Assessment: Faces Faces Pain Scale: Hurts a little bit Pain Location: abdomen Pain Descriptors / Indicators: Discomfort, Grimacing Pain Intervention(s): Limited activity within patient's tolerance, Monitored during session   Frequency  Min 2X/week        Progress Toward Goals  OT Goals(current goals can now be found in the care plan section)  Progress towards OT goals: Progressing toward goals  Acute Rehab OT Goals Patient Stated Goal: to get better OT Goal Formulation: With  patient Time For Goal Achievement: 10/14/24 Potential to Achieve Goals: Good ADL Goals Pt Will Perform Grooming: with supervision;sitting Pt Will Perform Upper Body Dressing: with supervision;sitting;standing Pt Will Perform Lower Body Dressing: with supervision;sitting/lateral leans;sit to/from stand Pt Will Transfer to Toilet: with supervision;ambulating;regular height toilet Pt Will Perform Tub/Shower Transfer: Shower transfer;with supervision;ambulating;shower seat   AM-PAC OT 6 Clicks Daily Activity     Outcome Measure   Help from another person eating meals?: A Little Help from another person taking care of personal grooming?: A Little Help from another person toileting, which includes using toliet, bedpan, or urinal?: A Little Help from another person bathing (including washing, rinsing, drying)?: A Little Help from another person to put on and taking off regular upper body clothing?: A Little Help from another person to put on and taking off regular lower body clothing?: A Lot 6 Click Score: 17    End of Session Equipment Utilized During Treatment: Rolling walker (2 wheels)  OT Visit Diagnosis: Unsteadiness on feet (R26.81);Other abnormalities of gait and mobility (R26.89);Muscle weakness (generalized) (M62.81);History of falling (Z91.81)   Activity Tolerance Patient tolerated treatment well   Patient Left in chair;with call bell/phone within reach;with nursing/sitter in room;with family/visitor present   Nurse Communication Mobility status        Time: 8487-8470 OT Time Calculation (min): 17 min  Charges: OT General Charges $OT Visit: 1 Visit OT Treatments $Therapeutic Activity: 8-22 mins  Lucie Kendall, OTR/L Acute Rehabilitation Services Office [phone] Secure Chat Communication Preferred   Lucie JONETTA Kendall 10/07/2024, 3:40 PM

## 2024-10-07 NOTE — Progress Notes (Signed)
 Patient ID: Sarah Bird, female   DOB: 1936/06/03, 88 y.o.   MRN: [id number] 10 Days Post-Op    Subjective: Doing OK Working on IS Eating some ROS negative except as listed above. Objective: Vital signs in last 24 hours: Temp:  [97.5 F (36.4 C)-98.8 F (37.1 C)] 97.9 F (36.6 C) (11/24 0735) Pulse Rate:  [83-102] 96 (11/24 0735) Resp:  [16-29] 25 (11/24 0735) BP: (114-147)/(67-83) 121/83 (11/24 0914) SpO2:  [92 %-97 %] 96 % (11/24 0735) Last BM Date : 10/07/24  Intake/Output from previous day: 11/23 0701 - 11/24 0700 In: 300 [P.O.:300] Out: 450 [Urine:450] Intake/Output this shift: No intake/output data recorded.  General appearance: alert and cooperative Resp: clear to auscultation bilaterally GI: soft, incision CDI with staples  Lab Results: CBC  Recent Labs    10/05/24 0449  WBC 9.4  HGB 10.9*  HCT 33.7*  PLT 496*   BMET Recent Labs    10/05/24 0449 10/06/24 0426  NA 135 136  K 3.9 3.8  CL 100 104  CO2 24 22  GLUCOSE 92 80  BUN 9 9  CREATININE 0.69 0.63  CALCIUM  8.7* 8.4*   PT/INR No results for input(s): LABPROT, INR in the last 72 hours. ABG No results for input(s): PHART, HCO3 in the last 72 hours.  Invalid input(s): PCO2, PO2  Studies/Results: No results found.  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 Sebastian, will need splenectomy vaccines prior to discharge, D/C staples 11/26 ABL anemia - due to above; hgb stable at 10.5 from 10.8.  Posterior scalp hematoma  Rhabdomyolysis - CK cleared. Off bicarb. AKI - resolved Elevated troponin - trended back down, suspect demand ischemia,  continue cardiac monitoring; EKG with afib without ST changes; Acute post-operative VDRF - extubated 11/15 and doing well Hyponatremia- resolved - 136 from 130 with additional IVF and holding lasix  . Hold lasix . Patient able to tell me on 11/23 that she was told to take lasix  as needed but does not know how to tell if she needs it so she just takes it every day. Monitor and will need to adjust based on her needs. Acute urinary retention - foley replaced 11/19, on flomax , urecholine , TOV today HTN - home meds resumed, trend Afib - hold DOAC; home cardizem ; this is prescribed by her PCP. Will send them a message regarding appropriateness of continuing in the setting of recurrent falls.   FEN - reg diet, tramadol ; oxy stopped 11/18 due to dizziness. Wean morphine . VTE - SCD's, lovenox  11/19 >> ID - got ancef  periop Foley - out and urinating   Dispo: progressive care, PT/OT, medically stable for CIR - appeal is pending.   LOS: 10 days    Dann Sebastian, MD, MPH, FACS Trauma & General Surgery Use [email or web address] to contact on call provider  10/07/2024

## 2024-10-07 NOTE — Progress Notes (Signed)
 Physical Therapy Treatment Patient Details Name: Sarah Bird MRN: [id number] DOB: 1935-12-31 Today's Date: 10/07/2024   History of Present Illness 88 y/o F presenting to [facility] on 11/14 after fall, workup significant for posterior scalp soft tissue injury, splenic rupture with hemoperitoneum and acute hemorrhage in gastrosplenic ligament. Transferred to [facility], s/p open splenectomy on 11/14. CT head, C spine, chest, abodome, pelvis negative.   PMH includes A fib on Eliquis, HTN, basal cell carcinoma, UTI    PT Comments  Pt states she has been mobilizing over the weekend with staff, and uncontrolled Bms have slowed down. Pt ambulatory around part of unit this date, overall requiring light assist for mobility particularly with log roll in and out of bed. Pt endorsing moderate abdominal pain throughout. Hrmax observed 122 bpm. Pt wanting to get intensive therapies prior to d/c home for return to functional independence. PT To continue to follow.      If plan is discharge home, recommend the following: A little help with bathing/dressing/bathroom;Assistance with cooking/housework;Assist for transportation;A lot of help with walking and/or transfers   Can travel by private vehicle        Equipment Recommendations  Rolling walker (2 wheels)    Recommendations for Other Services       Precautions / Restrictions Precautions Precautions: Fall Recall of Precautions/Restrictions: Impaired Precaution/Restrictions Comments: midline incision; abdominal binder; watch sats Restrictions Weight Bearing Restrictions Per Provider Order: No     Mobility  Bed Mobility Overal bed mobility: Needs Assistance Bed Mobility: Rolling, Sidelying to Sit Rolling: Min assist Sidelying to sit: Min assist     Sit to sidelying: Min assist General bed mobility comments: assist for trunk elevation, LE progression to EOB, min sequencing cues    Transfers Overall transfer level: Needs assistance Equipment used:  Rolling walker (2 wheels) Transfers: Sit to/from Stand Sit to Stand: Min assist           General transfer comment: min assist for rise and steady, cues for correct hand placement.    Ambulation/Gait Ambulation/Gait assistance: Contact guard assist, Min assist Gait Distance (Feet): 100 Feet Assistive device: Rolling walker (2 wheels) Gait Pattern/deviations: Step-through pattern, Decreased stride length, Trunk flexed Gait velocity: decr     General Gait Details: cues for upright posture, placement in RW   Stairs             Wheelchair Mobility     Tilt Bed    Modified Rankin (Stroke Patients Only)       Balance Overall balance assessment: Needs assistance Sitting-balance support: Feet supported Sitting balance-Leahy Scale: Good     Standing balance support: During functional activity, Reliant on assistive device for balance, Bilateral upper extremity supported Standing balance-Leahy Scale: Poor Standing balance comment: reliant on external support                            Communication Communication Communication: No apparent difficulties  Cognition Arousal: Alert Behavior During Therapy: WFL for tasks assessed/performed   PT - Cognitive impairments: No apparent impairments                         Following commands: Intact      Cueing Cueing Techniques: Verbal cues  Exercises      General Comments General comments (skin integrity, edema, etc.): encouraged IS, LE exercises while in bed      Pertinent Vitals/Pain Pain Assessment Pain Assessment: Faces Faces Pain  Scale: Hurts little more Pain Location: abdomen during bed mobility Pain Descriptors / Indicators: Discomfort, Grimacing Pain Intervention(s): Limited activity within patient's tolerance, Monitored during session, Repositioned    Home Living                          Prior Function            PT Goals (current goals can now be found in the  care plan section) Acute Rehab PT Goals Patient Stated Goal: Get well return home PT Goal Formulation: With patient Time For Goal Achievement: 10/15/24 Potential to Achieve Goals: Good Progress towards PT goals: Progressing toward goals    Frequency    Min 2X/week      PT Plan      Co-evaluation              AM-PAC PT 6 Clicks Mobility   Outcome Measure  Help needed turning from your back to your side while in a flat bed without using bedrails?: A Little Help needed moving from lying on your back to sitting on the side of a flat bed without using bedrails?: A Little Help needed moving to and from a bed to a chair (including a wheelchair)?: A Little Help needed standing up from a chair using your arms (e.g., wheelchair or bedside chair)?: A Little Help needed to walk in hospital room?: A Little Help needed climbing 3-5 steps with a railing? : A Lot 6 Click Score: 17    End of Session Equipment Utilized During Treatment: Other (comment) (abd binder) Activity Tolerance: Patient tolerated treatment well;Patient limited by fatigue Patient left: in chair;with call bell/phone within reach;with family/visitor present;with chair alarm set Nurse Communication: Mobility status PT Visit Diagnosis: Unsteadiness on feet (R26.81);Other abnormalities of gait and mobility (R26.89);Muscle weakness (generalized) (M62.81);History of falling (Z91.81);Difficulty in walking, not elsewhere classified (R26.2);Pain     Time: 1252-1310 PT Time Calculation (min) (ACUTE ONLY): 18 min  Charges:    $Gait Training: 8-22 mins PT General Charges $$ ACUTE PT VISIT: 1 Visit                     Johana RAMAN, PT DPT Acute Rehabilitation Services Secure Chat Preferred  Office [phone]    [place] 10/07/2024, 3:27 PM

## 2024-10-07 NOTE — Plan of Care (Signed)

## 2024-10-08 NOTE — Plan of Care (Signed)

## 2024-10-08 NOTE — Progress Notes (Signed)
 11 Days Post-Op  Subjective: CC: Stable intermittent LUQ pain when she sits. Pain controlled. She did not require any PRN pain medications yesterday. Tolerating reg diet without n/v. BM yesterday. Voiding. Oob with therapies. Reports stable dizziness with ambulation - last dose of meclizine  11/16.   Noted tachycardia on vitals. Tele reviewed. HR 90's while in the room. Denies cp, sob or feeling like she is retaining fluid.   Objective: Vital signs in last 24 hours: Temp:  [97.7 F (36.5 C)-98.4 F (36.9 C)] 98.1 F (36.7 C) (11/25 0735) Pulse Rate:  [89-110] 110 (11/25 0735) Resp:  [16-25] 19 (11/25 0735) BP: (121-152)/(61-99) 121/81 (11/25 0934) SpO2:  [95 %-98 %] 95 % (11/25 0735) Last BM Date : 10/08/24  Intake/Output from previous day: 11/24 0701 - 11/25 0700 In: 580 [P.O.:580] Out: -  Intake/Output this shift: No intake/output data recorded.  PE: Gen:  Alert, NAD, pleasant Card:  Irr rhythm, reg rate Pulm:  CTAB, no W/R/R, effort normal Abd: Soft, ND, mild LUQ ttp, midline incision with staples in place, cdi Ext:  Trace bilateral pedal edema Neuro: CN 3-12 grossly intact, MAE's, f/c, non-focal Psych: A&Ox3   Lab Results:  No results for input(s): WBC, HGB, HCT, PLT in the last 72 hours. BMET Recent Labs    10/06/24 0426  NA 136  K 3.8  CL 104  CO2 22  GLUCOSE 80  BUN 9  CREATININE 0.63  CALCIUM  8.4*   PT/INR No results for input(s): LABPROT, INR in the last 72 hours. CMP     Component Value Date/Time   NA 136 10/06/2024 0426   K 3.8 10/06/2024 0426   CL 104 10/06/2024 0426   CO2 22 10/06/2024 0426   GLUCOSE 80 10/06/2024 0426   BUN 9 10/06/2024 0426   CREATININE 0.63 10/06/2024 0426   CALCIUM  8.4 (L) 10/06/2024 0426   PROT 4.8 (L) 09/27/2024 1437   ALBUMIN  3.1 (L) 09/27/2024 1437   AST 113 (H) 09/27/2024 1437   ALT 41 09/27/2024 1437   ALKPHOS 56 09/27/2024 1437   BILITOT 0.3 09/27/2024 1437   GFRNONAA >60 10/06/2024 0426    Lipase  No results found for: LIPASE  Studies/Results: No results found.  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 Sebastian, will need splenectomy vaccines prior to discharge, D/C staples 11/26 ABL anemia - due to above; hgb stable at 10.5 from 10.8 on last check 11/22 Posterior scalp hematoma  Rhabdomyolysis - CK cleared. Off bicarb. AKI - resolved Elevated troponin - trended back down, suspect demand ischemia, continue cardiac monitoring; last EKG with afib without ST changes Acute post-operative VDRF - extubated 11/15 and doing well on RA.  Hyponatremia- resolved - 136 from 130 on last check (11/23) with additional IVF and holding lasix  . Hold lasix . Patient able to tell our group on 11/23 that she was told to take lasix  as needed but does not know how to tell if she needs it so she just takes it every day. Monitor and will need to adjust based on her needs. Acute urinary retention - resolved,  out, spont void HTN - home meds resumed, trend Afib - hold DOAC; home cardizem ; this is prescribed by her PCP. Will clarify if message has already been sent to her PCP regarding appropriateness of continuing in the setting of recurrent falls. Monitor HR. Can repeat labs if tachycardia persists or worsens.    FEN - reg diet VTE - SCD's, lovenox  11/19 >> ID - got ancef  periop Foley - out and urinating   Dispo: PT/OT, CIR appeal denied. Working towards SNF. Medically stable for SNF.  I reviewed nursing notes, last 24 h vitals and pain scores, last 48 h intake and output, last 24 h labs and trends, and last 24 h imaging results.    LOS: 11 days     Ozell CHRISTELLA Shaper, [facility] 10/08/2024, 9:52 AM Please see Amion for pager number during day hours 7:00am-4:30pm

## 2024-10-08 NOTE — Progress Notes (Signed)
 Inpatient Rehab Admissions Coordinator:   Received a denial from insurance for CIR after appeal.  I updated TOC and medical team and updated pt and her son at the bedside.  We will sign off for CIR at this time.   Reche Lowers, PT, DPT Admissions Coordinator [phone] 10/08/24 1:02 PM

## 2024-10-08 NOTE — TOC Progression Note (Addendum)
 Transition of Care [facility]) - Progression Note    Patient Details  Name: Sarah Bird MRN: [id number] Date of Birth: Jan 22, 1936  Transition of Care [facility]) CM/SW Contact  [name], LCSW Phone Number: 10/08/2024, 9:39 AM  Clinical Narrative:    CIR appeal was denied. Met with patient at bedside then called daughter Sarah Bird per patient's request. Provided bed offers and Medicare ratings for [employer]. ([facility] Representative Sarah Bird states no beds this week.) Sarah Bird would like to talk with her brothers and follow up with CSW shortly with choice. She is aware we will need to get SNF insurance authorization.  1:00- Patient and family chose [facility]. CSW called Sarah Bird at [facility] - they can accept patient and have beds available once auth is approved. Started auth in [facility] portal.   2:10- Sarah Bird is still pending, Sarah Bird states they have a bed tomorrow if Sarah Bird is approved. Care Team aware.   Expected Discharge Plan: IP Rehab Facility Barriers to Discharge: Continued Medical Work up               Expected Discharge Plan and Services   Discharge Planning Services: CM Consult Post Acute Care Choice: IP Rehab Living arrangements for the past 2 months: Single Family Home                                       Social Drivers of Health (SDOH) Interventions SDOH Screenings   Food Insecurity: No Food Insecurity (09/28/2024)  Housing: Low Risk  (09/28/2024)  Transportation Needs: No Transportation Needs (09/28/2024)  Utilities: Not At Risk (09/28/2024)  Financial Resource Strain: Low Risk  (08/26/2024)   Received from [facility]  Social Connections: Moderately Integrated (09/28/2024)  Tobacco Use: Low Risk  (09/27/2024)    Readmission Risk Interventions     No data to display

## 2024-10-09 MED ORDER — METHOCARBAMOL 750 MG PO TABS: 750.0000 mg | ORAL_TABLET | Freq: Three times a day (TID) | ORAL | Status: AC | PRN

## 2024-10-09 MED ORDER — ACETAMINOPHEN 500 MG PO TABS: 1000.0000 mg | ORAL_TABLET | Freq: Four times a day (QID) | ORAL | Status: AC

## 2024-10-09 MED ORDER — MENINGOCOCCAL A C Y&W-135 OLIG IM SOLN
0.5000 mL | INTRAMUSCULAR | Status: AC | PRN
  Administered 2024-10-09: 0.5 mL via INTRAMUSCULAR
  Filled 2024-10-09 (×4): qty 0.5

## 2024-10-09 MED ORDER — SENNOSIDES-DOCUSATE SODIUM 8.6-50 MG PO TABS: 1.0000 | ORAL_TABLET | Freq: Two times a day (BID) | ORAL | Status: AC

## 2024-10-09 MED ORDER — ENSURE PLUS HIGH PROTEIN PO LIQD: 237.0000 mL | Freq: Two times a day (BID) | ORAL | Status: AC

## 2024-10-09 MED ORDER — HAEMOPHILUS B POLYSAC CONJ VAC 10 MCG IJ SOLR
0.5000 mL | INTRAMUSCULAR | Status: AC | PRN
  Administered 2024-10-09: 0.5 mL via INTRAMUSCULAR
  Filled 2024-10-09 (×2): qty 0.5

## 2024-10-09 MED ORDER — BETHANECHOL CHLORIDE 5 MG PO TABS: 5.0000 mg | ORAL_TABLET | Freq: Three times a day (TID) | ORAL | Status: AC

## 2024-10-09 MED ORDER — PNEUMOCOCCAL 20-VAL CONJ VACC 0.5 ML IM SUSY
0.5000 mL | PREFILLED_SYRINGE | INTRAMUSCULAR | Status: AC | PRN
  Administered 2024-10-09: 0.5 mL via INTRAMUSCULAR
  Filled 2024-10-09: qty 0.5

## 2024-10-09 MED ORDER — MENINGOCOCCAL VAC B (OMV) IM SUSY
0.5000 mL | PREFILLED_SYRINGE | INTRAMUSCULAR | Status: AC | PRN
  Administered 2024-10-09: 0.5 mL via INTRAMUSCULAR
  Filled 2024-10-09 (×2): qty 0.5

## 2024-10-09 MED ORDER — MECLIZINE HCL 12.5 MG PO TABS: 12.5000 mg | ORAL_TABLET | Freq: Three times a day (TID) | ORAL | 0 refills | Status: AC | PRN

## 2024-10-09 MED ORDER — POLYETHYLENE GLYCOL 3350 17 G PO PACK: 17.0000 g | PACK | Freq: Every day | ORAL | 0 refills | Status: AC | PRN

## 2024-10-09 NOTE — TOC Progression Note (Signed)
 Transition of Care [facility]) - Progression Note    Patient Details  Name: Sarah Bird MRN: [id number] Date of Birth: 1935-12-11  Transition of Care [facility]) CM/SW Contact  Inocente GORMAN Kindle, LCSW Phone Number: 10/09/2024, 11:59 AM  Clinical Narrative:    Insurance approval received for [employer], Ref# [id number], Auth ID# [id number], effective 10/09/2024-10/14/2024.    Expected Discharge Plan: IP Rehab Facility Barriers to Discharge: Continued Medical Work up               Expected Discharge Plan and Services   Discharge Planning Services: CM Consult Post Acute Care Choice: IP Rehab Living arrangements for the past 2 months: Single Family Home                                       Social Drivers of Health (SDOH) Interventions SDOH Screenings   Food Insecurity: No Food Insecurity (09/28/2024)  Housing: Low Risk  (09/28/2024)  Transportation Needs: No Transportation Needs (09/28/2024)  Utilities: Not At Risk (09/28/2024)  Financial Resource Strain: Low Risk  (08/26/2024)   Received from [facility]  Social Connections: Moderately Integrated (09/28/2024)  Tobacco Use: Low Risk  (09/27/2024)    Readmission Risk Interventions     No data to display

## 2024-10-09 NOTE — Plan of Care (Signed)

## 2024-10-09 NOTE — TOC Transition Note (Signed)
 Transition of Care [facility]) - Discharge Note   Patient Details  Name: Sarah Bird MRN: [id number] Date of Birth: 1936/06/22  Transition of Care [facility]) CM/SW Contact:  Josepha Mliss HERO, RN Phone Number: 10/09/2024, 12:24pm  Clinical Narrative:    Insurance auth received for admission to [facility] in [place], [place]. Notified Briana in admissions at facility.  Bed available today; she will go to room E7 and nursing report can be called to [phone].    Addendum: 1:45pm PTAR notified for transport to facility; estimated time of arrival within 30-45 min. Patient has notified her daughter regarding her transfer.    Final next level of care: Skilled Nursing Facility Barriers to Discharge: Barriers Resolved   Patient Goals and CMS Choice   CMS Medicare.gov Compare Post Acute Care list provided to:: Patient Choice offered to / list presented to : Patient                            Discharge Plan and Services Additional resources added to the After Visit Summary for     Discharge Planning Services: CM Consult Post Acute Care Choice: Skilled Nursing Facility                               Social Drivers of Health (SDOH) Interventions SDOH Screenings   Food Insecurity: No Food Insecurity (09/28/2024)  Housing: Low Risk  (09/28/2024)  Transportation Needs: No Transportation Needs (09/28/2024)  Utilities: Not At Risk (09/28/2024)  Financial Resource Strain: Low Risk  (08/26/2024)   Received from [facility]  Social Connections: Moderately Integrated (09/28/2024)  Tobacco Use: Low Risk  (09/27/2024)     Readmission Risk Interventions     No data to display         Mliss MICAEL Josepha, RN, BSN  Trauma/Neuro ICU Case Manager [phone]

## 2024-10-09 NOTE — Progress Notes (Signed)
 Physical Therapy Treatment Patient Details Name: Sarah Bird MRN: [id number] DOB: 02/19/1936 Today's Date: 10/09/2024   History of Present Illness 88 y/o F presenting to [facility] on 11/14 after fall, workup significant for posterior scalp soft tissue injury, splenic rupture with hemoperitoneum and acute hemorrhage in gastrosplenic ligament. Transferred to [facility], s/p open splenectomy on 11/14. CT head, C spine, chest, abodome, pelvis negative.   PMH includes A fib on Eliquis, HTN, basal cell carcinoma, UTI    PT Comments  Pt progressing well, ambulating good hallway distance with standing rest as needed. Pt with tendency for forward flexed posture in standing given pain, but corrects well with cues. Pt denied from CIR, plan is for short-term rehab <3 hours/day so pt can get as independent as possible prior to d/c home.      If plan is discharge home, recommend the following: A little help with bathing/dressing/bathroom;Assistance with cooking/housework;Assist for transportation;A little help with walking and/or transfers   Can travel by private vehicle        Equipment Recommendations  Rolling walker (2 wheels)    Recommendations for Other Services       Precautions / Restrictions Precautions Precautions: Fall Recall of Precautions/Restrictions: Impaired Precaution/Restrictions Comments: midline incision; abdominal binder (not donned during session, unable to locate) Restrictions Weight Bearing Restrictions Per Provider Order: No     Mobility  Bed Mobility Overal bed mobility: Needs Assistance             General bed mobility comments: OOB on arrival    Transfers Overall transfer level: Needs assistance Equipment used: Rolling walker (2 wheels) Transfers: Sit to/from Stand Sit to Stand: Contact guard assist           General transfer comment: for safety    Ambulation/Gait Ambulation/Gait assistance: Contact guard assist Gait Distance (Feet): 125 Feet Assistive  device: Rolling walker (2 wheels) Gait Pattern/deviations: Step-through pattern, Decreased stride length, Trunk flexed Gait velocity: decr     General Gait Details: cues for upright posture, standing rest x1 to recover fatigue   Stairs             Wheelchair Mobility     Tilt Bed    Modified Rankin (Stroke Patients Only)       Balance Overall balance assessment: Needs assistance Sitting-balance support: Feet supported Sitting balance-Leahy Scale: Good     Standing balance support: During functional activity, Bilateral upper extremity supported Standing balance-Leahy Scale: Poor Standing balance comment: reliant on external support                            Communication Communication Communication: No apparent difficulties  Cognition Arousal: Alert Behavior During Therapy: WFL for tasks assessed/performed   PT - Cognitive impairments: No apparent impairments                         Following commands: Intact      Cueing Cueing Techniques: Verbal cues  Exercises      General Comments General comments (skin integrity, edema, etc.): HRmax observed 130 bpm      Pertinent Vitals/Pain Pain Assessment Pain Assessment: Faces Faces Pain Scale: Hurts little more Pain Location: abdomen Pain Descriptors / Indicators: Discomfort, Grimacing Pain Intervention(s): Limited activity within patient's tolerance, Monitored during session, Repositioned    Home Living  Prior Function            PT Goals (current goals can now be found in the care plan section) Acute Rehab PT Goals Patient Stated Goal: Get well return home PT Goal Formulation: With patient Time For Goal Achievement: 10/15/24 Potential to Achieve Goals: Good Progress towards PT goals: Progressing toward goals    Frequency    Min 2X/week      PT Plan      Co-evaluation              AM-PAC PT 6 Clicks Mobility   Outcome  Measure  Help needed turning from your back to your side while in a flat bed without using bedrails?: A Little Help needed moving from lying on your back to sitting on the side of a flat bed without using bedrails?: A Little Help needed moving to and from a bed to a chair (including a wheelchair)?: A Little Help needed standing up from a chair using your arms (e.g., wheelchair or bedside chair)?: A Little Help needed to walk in hospital room?: A Little Help needed climbing 3-5 steps with a railing? : A Lot 6 Click Score: 17    End of Session   Activity Tolerance: Patient tolerated treatment well;Patient limited by fatigue Patient left: in chair;with call bell/phone within reach;with chair alarm set Nurse Communication: Mobility status PT Visit Diagnosis: Unsteadiness on feet (R26.81);Other abnormalities of gait and mobility (R26.89);Muscle weakness (generalized) (M62.81);History of falling (Z91.81);Difficulty in walking, not elsewhere classified (R26.2);Pain     Time: 9190-9165 PT Time Calculation (min) (ACUTE ONLY): 25 min  Charges:    $Gait Training: 8-22 mins PT General Charges $$ ACUTE PT VISIT: 1 Visit                     Johana RAMAN, PT DPT Acute Rehabilitation Services Secure Chat Preferred  Office [phone]    Dandrea Widdowson FORBES [place] 10/09/2024, 10:51 AM

## 2024-10-09 NOTE — Discharge Summary (Signed)
 [facility] Discharge Summary   Patient ID: Sarah Bird MRN: [id number] DOB/AGE: 1936/08/26 88 y.o.  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  Procedures Dr. Sebastian 09/27/14 open splenectomy   HPI:  Sarah Bird is an 88 y/o F with a PMH A-fib on Eliquis, hypertension, basal cell carcinoma, and recent treatment for UTI who [facility] after a ground-level fall.  States her legs gave out while walking and she fell to the floor.  Denies loss of consciousness.  Found by her son the next morning who called EMS.  She was hemodynamically stable in the ED. Workup at [facility] was significant for a posterior scalp soft tissue injury and splenic rupture with hemoperitoneum and acute hemorrhage in the gastrosplenic ligament.  Otherwise CT head, C-spine, chest, abdomen, pelvis were negative.  Patient was transferred to [facility] trauma center for emergent splenectomy and admission.   Last reported dose of Eliquis was 11/13 in the morning Surgical history significant for abdominal hysterectomy Patient denies tobacco or drug use.  Does report alcohol use, 7 drinks weekly .   Hospital Course:  Below is a complete list of the patients traumatic injuries along with their management:   88 y/o F s/p GLF on Eliquis  Ruptured spleen with 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 Sebastian, given post-splenectomy vaccines prior to discharge on 11/26, abdominal staples removed 11/26 ABL anemia - due to above; hgb stable at 10.9 from 10.1 on last check 11/22 Posterior scalp hematoma  Rhabdomyolysis - CK cleared. Off bicarb. AKI -  resolved Elevated troponin - trended back down, suspect demand ischemia, D/C cardiac monitoring; last EKG with afib without ST changes Acute post-operative VDRF - extubated 11/15 and doing well on room air.  Hyponatremia- resolved - 136 on last check (11/23) with additional IVF and holding lasix  . Hold lasix . Patient able to tell our group on 11/23 that she was told to take lasix  as needed but does not know how to tell if she needs it so she just takes it every day. Monitor and will need to adjust based on her needs. Acute urinary retention - resolved, out, spont void HTN - home meds resumed, trend Afib - hold DOAC; home cardizem ; this is prescribed by her PCP. Message sent to PCP regarding follow up to discuss risk vs benefit of resumption in the setting of recurrent falls.   FEN - reg diet VTE - SCD's, lovenox  11/19 >> Dispo: PT/OT, CIR appeal denied.   On 10/09/24 the patient was medically stable for discharge to skilled nursing facility.   I have personally reviewed the patients medication history on the [place] controlled substance database.    Physical Exam: Gen:  Alert, NAD, pleasant , up in the chair Card:  Irr rhythm, HR 98-105 bpm during my exam Pulm:  CTAB, no W/R/R, effort normal Abd: Soft, ND, mild LUQ ttp, midline incision with staples in place, cdi Ext:  Trace bilateral pedal edema Neuro: CN 3-12 grossly intact, MAE's, f/c, non-focal Psych: A&Ox3     Allergies as of 10/09/2024       Reactions   Actonel [risedronate] Other (See Comments)   Unknown reaction   Cozaar [losartan] Other (See  Comments)   Unknown reaction   Sulfa Antibiotics Rash        Medication List     STOP taking these medications    ciprofloxacin  500 MG tablet Commonly known as: CIPRO    diphenhydramine-acetaminophen  25-500 MG Tabs tablet Commonly known as: TYLENOL  PM   Eliquis 5 MG Tabs tablet Generic drug: apixaban   furosemide  20 MG tablet Commonly known as: LASIX        TAKE these  medications    acetaminophen  500 MG tablet Commonly known as: TYLENOL  Take 2 tablets (1,000 mg total) by mouth every 6 (six) hours. What changed:  when to take this reasons to take this   azelastine 0.1 % nasal spray Commonly known as: ASTELIN Place 1-2 sprays into both nostrils 2 (two) times daily as needed for allergies or rhinitis.   bethanechol  5 MG tablet Commonly known as: URECHOLINE  Take 1 tablet (5 mg total) by mouth 3 (three) times daily.   diltiazem  120 MG 24 hr capsule Commonly known as: CARDIZEM  CD Take 120 mg by mouth daily.   feeding supplement Liqd Take 237 mLs by mouth 2 (two) times daily between meals.   meclizine  12.5 MG tablet Commonly known as: ANTIVERT  Take 1 tablet (12.5 mg total) by mouth 3 (three) times daily as needed for dizziness.   methocarbamol  750 MG tablet Commonly known as: ROBAXIN  Take 1 tablet (750 mg total) by mouth every 8 (eight) hours as needed for muscle spasms.   polyethylene glycol 17 g packet Commonly known as: MIRALAX  / GLYCOLAX  Take 17 g by mouth daily as needed for moderate constipation or mild constipation.   potassium chloride  10 MEQ tablet Commonly known as: KLOR-CON  Take 10 mEq by mouth daily.   senna-docusate 8.6-50 MG tablet Commonly known as: Senokot-S Take 1 tablet by mouth 2 (two) times daily.   VITAMIN B-12 PO Take 1 tablet by mouth daily.   VITAMIN D-3 PO Take 1 capsule by mouth daily.           Signed: [name], [facility] 10/09/2024, 12:43 PM

## 2024-10-09 NOTE — Progress Notes (Signed)
 28 staples removed from pt's abdominal incision per order with no complications. Pt tolerated removal well. Abd incision approximated with no drainage noted and open to air.

## 2024-10-09 NOTE — Progress Notes (Signed)
   10/09/24 1350  Hand-off documentation  Hand-off Given Given to Transfer Unit/facility ([facility])  Report given to (Full Name) [place], RN   AVS and discharge paperwork handed to PTAR to give to receiving facitliy. PIV removed with no complications. Tele removed and CCMD called. Pt transported off unit via [facility] with all personal belongings.
